# Patient Record
Sex: Female | Born: 1950 | Race: White | Hispanic: No | Marital: Married | Smoking: Former smoker
Health system: Southern US, Community
[De-identification: ages and names within clinical notes are randomized; demographics above are authoritative.]

## PROBLEM LIST (undated history)

## (undated) DIAGNOSIS — J349 Unspecified disorder of nose and nasal sinuses: Secondary | ICD-10-CM

## (undated) DIAGNOSIS — T7840XA Allergy, unspecified, initial encounter: Secondary | ICD-10-CM

## (undated) DIAGNOSIS — E119 Type 2 diabetes mellitus without complications: Secondary | ICD-10-CM

## (undated) DIAGNOSIS — J439 Emphysema, unspecified: Secondary | ICD-10-CM

## (undated) HISTORY — DX: Emphysema, unspecified: J43.9

## (undated) HISTORY — DX: Unspecified disorder of nose and nasal sinuses: J34.9

## (undated) HISTORY — PX: NASAL SINUS SURGERY: SHX719

## (undated) HISTORY — DX: Type 2 diabetes mellitus without complications: E11.9

## (undated) HISTORY — DX: Allergy, unspecified, initial encounter: T78.40XA

## (undated) HISTORY — PX: TUBAL LIGATION: SHX77

---

## 2019-01-26 ENCOUNTER — Other Ambulatory Visit: Payer: Self-pay

## 2019-01-26 ENCOUNTER — Encounter: Payer: Self-pay | Admitting: Internal Medicine

## 2019-01-26 ENCOUNTER — Ambulatory Visit (INDEPENDENT_AMBULATORY_CARE_PROVIDER_SITE_OTHER): Payer: Medicare Other | Admitting: Internal Medicine

## 2019-01-26 VITALS — BP 142/86 | HR 112 | Ht 63.0 in | Wt 132.4 lb

## 2019-01-26 DIAGNOSIS — R918 Other nonspecific abnormal finding of lung field: Secondary | ICD-10-CM

## 2019-01-26 DIAGNOSIS — R05 Cough: Secondary | ICD-10-CM

## 2019-01-26 DIAGNOSIS — R059 Cough, unspecified: Secondary | ICD-10-CM

## 2019-01-26 DIAGNOSIS — J441 Chronic obstructive pulmonary disease with (acute) exacerbation: Secondary | ICD-10-CM

## 2019-01-26 DIAGNOSIS — E1100 Type 2 diabetes mellitus with hyperosmolarity without nonketotic hyperglycemic-hyperosmolar coma (NKHHC): Secondary | ICD-10-CM

## 2019-01-26 DIAGNOSIS — K219 Gastro-esophageal reflux disease without esophagitis: Secondary | ICD-10-CM | POA: Diagnosis not present

## 2019-01-26 DIAGNOSIS — J449 Chronic obstructive pulmonary disease, unspecified: Secondary | ICD-10-CM

## 2019-01-26 LAB — CBC WITH DIFFERENTIAL/PLATELET
Basophils Absolute: 0.1 10*3/uL (ref 0.0–0.1)
Basophils Relative: 0.8 % (ref 0.0–3.0)
Eosinophils Absolute: 0.1 10*3/uL (ref 0.0–0.7)
Eosinophils Relative: 2.2 % (ref 0.0–5.0)
HCT: 39.3 % (ref 36.0–46.0)
HEMOGLOBIN: 13.4 g/dL (ref 12.0–15.0)
Lymphocytes Relative: 22.7 % (ref 12.0–46.0)
Lymphs Abs: 1.5 10*3/uL (ref 0.7–4.0)
MCHC: 34 g/dL (ref 30.0–36.0)
MCV: 82.8 fl (ref 78.0–100.0)
MONO ABS: 0.4 10*3/uL (ref 0.1–1.0)
Monocytes Relative: 6.5 % (ref 3.0–12.0)
Neutro Abs: 4.5 10*3/uL (ref 1.4–7.7)
Neutrophils Relative %: 67.8 % (ref 43.0–77.0)
Platelets: 222 10*3/uL (ref 150.0–400.0)
RBC: 4.75 Mil/uL (ref 3.87–5.11)
RDW: 13.1 % (ref 11.5–15.5)
WBC: 6.6 10*3/uL (ref 4.0–10.5)

## 2019-01-26 MED ORDER — BENZONATATE 200 MG PO CAPS: 200.0000 mg | ORAL_CAPSULE | Freq: Three times a day (TID) | ORAL | 1 refills | Status: DC | PRN

## 2019-01-26 NOTE — Patient Instructions (Addendum)
Order- lab- hypersensitivity pneumonia panel, Allergy profile, CBC w diff,   Dx COPD exacerbation  Permit for record release- all records from Dr Steele Berg at [facility] sent for benzonatate perles for cough

## 2019-01-26 NOTE — Progress Notes (Signed)
01/26/2019- 30 yoF with dx emphysema previously followed by pulmonologist in [place]. She is asking second opinion. I had seen her husband in past for COPD (expired). -----referred by self; Dr. Konrad Dolores [facility] (old pulmonary doctor), been Dx w/ emphysema, has chronic cough  I had cared for husband Konrad Dolores who died around Mar 27, 2010 with emphysema and peripheral arterial disease. I reviewed CT chest image disk from [facility] dated 12/12/2018- noting LUL irregular  density.  She complains of cough x 3 years, worst, and productive of light green phlegm in AM.Sputum Inhalers help.Flovent 220 w/ spacer. Stays hoarse. Rarely uses rescue inhaler. Lives in old family home which has been remediated for leaks. She has done a home self-test for mold, with interpretation pending, but brings a petri dish with what appear to be areas of mold growth. She is worried that her cough is due to mold. An allergy work-up was negative in the past. She understands PFT "mildly abnormal". Denies fever, sweats, weight loss. Denies GERD or heartburn but did have a swallow evaluation.  CT chest ([facility]) 12/12/2018-  Conclusions: There are a few new subcentimeter pulmonary nodules, likely benign, although at imaging follow-up is advised. Consider low-dose chest CT follow-up in 1 year. Small scattered areas of groundglass pulmonary pacification in the right upper lobe suggests acute infectious/inflammatory pulmonary disease. Chronic findings, as reported, including centrilobular and paraseptal emphysema and LAD coronary calcifications.  Prior to Admission medications   Medication Sig Start Date End Date Taking? Authorizing Provider  Albuterol Sulfate 108 (90 Base) MCG/ACT AEPB Inhale into the lungs. Takes 1 puff every 4-6 hours as needed   Yes [provider]  aspirin EC 81 MG tablet Take 81 mg by mouth daily.   Yes [provider]  fluticasone (FLOVENT HFA) 110 MCG/ACT inhaler Inhale 2 puffs into the lungs 2 (two) times  daily.   Yes [provider]  Loratadine 10 MG CAPS Take 10 mg by mouth every morning.   Yes [provider]  metFORMIN (GLUCOPHAGE) 500 MG tablet Take 500 mg by mouth 2 (two) times daily with a meal.   Yes [provider]  sertraline (ZOLOFT) 50 MG tablet Take 50 mg by mouth daily.   Yes [provider]  benzonatate (TESSALON) 200 MG capsule Take 1 capsule (200 mg total) by mouth 3 (three) times daily as needed for cough. 01/26/19   Waymon Budge, MD   Past Medical History:  Diagnosis Date  . Allergy   . Diabetes (HCC)   . Emphysema lung (HCC)   . Sinus trouble    Past Surgical History:  Procedure Laterality Date  . NASAL SINUS SURGERY    . TUBAL LIGATION     Family History  Problem Relation Age of Onset  . Cancer Maternal Grandmother    Social History   Socioeconomic History  . Marital status: Married    Spouse name: Not on file  . Number of children: Not on file  . Years of education: Not on file  . Highest education level: Not on file  Occupational History  . Not on file  Social Needs  . Financial resource strain: Not on file  . Food insecurity:    Worry: Not on file    Inability: Not on file  . Transportation needs:    Medical: Not on file    Non-medical: Not on file  Tobacco Use  . Smoking status: Former Smoker    Packs/day: 1.00    Years: 12.00  Pack years: 12.00    Types: Cigarettes    Last attempt to quit: 01/25/1994    Years since quitting: 25.0  . Smokeless tobacco: Never Used  Substance and Sexual Activity  . Alcohol use: Never    Frequency: Never  . Drug use: Never  . Sexual activity: Not on file  Lifestyle  . Physical activity:    Days per week: Not on file    Minutes per session: Not on file  . Stress: Not on file  Relationships  . Social connections:    Talks on phone: Not on file    Gets together: Not on file    Attends religious service: Not on file    Active member of club or organization: Not on  file    Attends meetings of clubs or organizations: Not on file    Relationship status: Not on file  . Intimate partner violence:    Fear of current or ex partner: Not on file    Emotionally abused: Not on file    Physically abused: Not on file    Forced sexual activity: Not on file  Other Topics Concern  . Not on file  Social History Narrative  . Not on file   ROS-see HPI   + = positive Constitutional:    weight loss, night sweats, fevers, chills, fatigue, lassitude. HEENT:    headaches, +difficulty swallowing, tooth/dental problems, sore throat,       sneezing, +itching, ear ache, nasal congestion, post nasal drip, snoring CV:    chest pain, orthopnea, PND, swelling in lower extremities, anasarca,                                  dizziness, palpitations Resp:   +shortness of breath with exertion or at rest.                +productive cough,   non-productive cough, coughing up of blood.              +change in color of mucus.  wheezing.   Skin:    rash or lesions. GI:  No-   heartburn, indigestion, abdominal pain, nausea, vomiting, diarrhea,                 change in bowel habits, loss of appetite GU: dysuria, change in color of urine, no urgency or frequency.   flank pain. MS:   joint pain, stiffness, decreased range of motion, back pain. Neuro-     nothing unusual Psych:  change in mood or affect.  depression or anxiety.   memory loss.  OBJ- Physical Exam General- Alert, Oriented, Affect-appropriate, Distress- none acute Skin- rash-none, lesions- none, excoriation- none Lymphadenopathy- none Head- atraumatic            Eyes- Gross vision intact, PERRLA, conjunctivae and secretions clear            Ears- Hearing, canals-normal            Nose- Clear, no-Septal dev, mucus, polyps, erosion, perforation             Throat- Mallampati II , mucosa clear , drainage- none, tonsils- atrophic Neck- flexible , trachea midline, no stridor , thyroid nl, carotid no bruit Chest -  symmetrical excursion , unlabored           Heart/CV- RRR , no murmur , no gallop  , no rub, nl s1 s2                           -  JVD- none , edema- none, stasis changes- none, varices- none           Lung- clear to P&A, wheeze- none, cough- none , dullness-none, rub- none           Chest wall-  Abd-  Br/ Gen/ Rectal- Not done, not indicated Extrem- cyanosis- none, clubbing, none, atrophy- none, strength- nl Neuro- grossly intact to observation

## 2019-01-27 LAB — RESPIRATORY ALLERGY PROFILE REGION II ~~LOC~~
Allergen, A. alternata, m6: <0.1 kU/L
Allergen, Cedar tree, t12: <0.1 kU/L
Allergen, Comm Silver Birch, t9: <0.1 kU/L
Allergen, Cottonwood, t14: <0.1 kU/L
Allergen, D pternoyssinus,d7: 0.27 kU/L — ABNORMAL HIGH
Allergen, Mulberry, t76: <0.1 kU/L
Allergen, Oak,t7: <0.1 kU/L
Allergen, P. notatum, m1: <0.1 kU/L
Aspergillus fumigatus, m3: <0.1 kU/L
Bermuda Grass: <0.1 kU/L
Box Elder IgE: <0.1 kU/L
CLADOSPORIUM HERBARUM (M2) IGE: <0.1 kU/L
CLASS: 0
CLASS: 0
CLASS: 0
CLASS: 0
COCKROACH: 3.43 kU/L — AB
COMMON RAGWEED (SHORT) (W1) IGE: <0.1 kU/L
Class: 0
Class: 0
Class: 0
Class: 0
Class: 0
Class: 0
Class: 0
Class: 0
Class: 0
Class: 0
Class: 0
Class: 0
Class: 0
Class: 0
Class: 0
Class: 0
Class: 0
Class: 0
Class: 0
Class: 2
D. farinae: 0.3 kU/L — ABNORMAL HIGH
Dog Dander: <0.1 kU/L
Elm IgE: <0.1 kU/L
IgE (Immunoglobulin E), Serum: 45 kU/L (ref <=114)
Johnson Grass: <0.1 kU/L
Pecan/Hickory Tree IgE: <0.1 kU/L
Sheep Sorrel IgE: <0.1 kU/L

## 2019-01-27 LAB — INTERPRETATION:

## 2019-01-29 DIAGNOSIS — K219 Gastro-esophageal reflux disease without esophagitis: Secondary | ICD-10-CM | POA: Insufficient documentation

## 2019-01-29 DIAGNOSIS — R918 Other nonspecific abnormal finding of lung field: Secondary | ICD-10-CM | POA: Insufficient documentation

## 2019-01-29 DIAGNOSIS — J449 Chronic obstructive pulmonary disease, unspecified: Secondary | ICD-10-CM | POA: Insufficient documentation

## 2019-01-29 DIAGNOSIS — R05 Cough: Secondary | ICD-10-CM | POA: Insufficient documentation

## 2019-01-29 DIAGNOSIS — R059 Cough, unspecified: Secondary | ICD-10-CM | POA: Insufficient documentation

## 2019-01-29 DIAGNOSIS — E119 Type 2 diabetes mellitus without complications: Secondary | ICD-10-CM | POA: Insufficient documentation

## 2019-01-29 NOTE — Assessment & Plan Note (Signed)
Outside records are pending. She has Flovent and rarely uses rescue inhaler.

## 2019-01-29 NOTE — Assessment & Plan Note (Signed)
Potentially related to cough complaint. She describes dysphagia, but not heartburn, and has had swallowing eval. Plan- emphasize reflux precautions

## 2019-01-29 NOTE — Assessment & Plan Note (Signed)
Daily cough x 3 years, worst in the mornings and usually productive of light-green sputum with no blood. We will request records of precious work-up, and await result of her home mold test because this is what most concerns her. Recognize her COPD and GERD may contribute. Plan- hypersensitivity pneumonia and mold antibody assays.

## 2019-01-29 NOTE — Assessment & Plan Note (Signed)
Need to clarify with her how much of her management she wants to continue at [facility]. Radiology report suggests low-dose CT f/u in 1 year (Jan, 2021).

## 2019-01-30 LAB — HYPERSENSITIVITY PNEUMONITIS
A. PULLULANS ABS: NEGATIVE
A.Fumigatus #1 Abs: NEGATIVE
Micropolyspora faeni, IgG: NEGATIVE
Pigeon Serum Abs: NEGATIVE
THERMOACT. SACCHARII: NEGATIVE
Thermoactinomyces vulgaris, IgG: NEGATIVE

## 2019-02-02 ENCOUNTER — Telehealth: Payer: Self-pay | Admitting: Internal Medicine

## 2019-02-02 NOTE — Telephone Encounter (Signed)
Notes recorded by Waymon Budge, MD on 01/30/2019 at 8:08 PM EDT Allergy antibodies are increased for common environmental allergies- house dust mites and cockroach- but not to any of the molds commonly associated with allergic response on our panels. _______________  Alice Patton and spoke with patient she is aware and verbalized understanding. Nothing further needed.

## 2019-03-27 ENCOUNTER — Ambulatory Visit: Payer: Medicare Other | Admitting: Internal Medicine

## 2019-07-07 ENCOUNTER — Ambulatory Visit: Payer: Medicare Other | Admitting: Internal Medicine

## 2019-07-17 ENCOUNTER — Other Ambulatory Visit: Payer: Self-pay

## 2019-07-17 ENCOUNTER — Encounter: Payer: Self-pay | Admitting: Primary Care

## 2019-07-17 ENCOUNTER — Ambulatory Visit (INDEPENDENT_AMBULATORY_CARE_PROVIDER_SITE_OTHER): Payer: Medicare Other | Admitting: Primary Care

## 2019-07-17 DIAGNOSIS — J449 Chronic obstructive pulmonary disease, unspecified: Secondary | ICD-10-CM

## 2019-07-17 DIAGNOSIS — R05 Cough: Secondary | ICD-10-CM

## 2019-07-17 DIAGNOSIS — R918 Other nonspecific abnormal finding of lung field: Secondary | ICD-10-CM

## 2019-07-17 DIAGNOSIS — J441 Chronic obstructive pulmonary disease with (acute) exacerbation: Secondary | ICD-10-CM

## 2019-07-17 DIAGNOSIS — R059 Cough, unspecified: Secondary | ICD-10-CM

## 2019-07-17 MED ORDER — FLUTICASONE PROPIONATE HFA 110 MCG/ACT IN AERO: 2.0000 | INHALATION_SPRAY | Freq: Two times a day (BID) | RESPIRATORY_TRACT | 1 refills | Status: DC

## 2019-07-17 MED ORDER — BENZONATATE 200 MG PO CAPS: 200.0000 mg | ORAL_CAPSULE | Freq: Three times a day (TID) | ORAL | 1 refills | Status: DC | PRN

## 2019-07-17 MED ORDER — ALBUTEROL SULFATE 108 (90 BASE) MCG/ACT IN AEPB: 2.0000 | INHALATION_SPRAY | Freq: Two times a day (BID) | RESPIRATORY_TRACT | 0 refills | Status: DC

## 2019-07-17 NOTE — Assessment & Plan Note (Addendum)
-   No shortness of breath or wheezing  - Using flovent/albuterol hfa as needed  - Offered influenza vaccine but patient declined- will follow up with PCP

## 2019-07-17 NOTE — Assessment & Plan Note (Signed)
-   Needs follow-up CT chest in Jan 2021  - CT chest 12/12/18 with [facility] subcentimeter

## 2019-07-17 NOTE — Progress Notes (Signed)
 @Patient  ID: Alice Patton, female    DOB: 1951-06-04, 68 y.o.   MRN: [id number]  Chief Complaint  Patient presents with   Follow-up    follow-up COPD.  pt states she is doing well, states that her sob and cough have improved greatly.  has not used albuterol or flovent in 1 month.     Referring provider: Carolan ClinesSnider, Gina M, PA  HPI: 68 year old female, former smoker quit in 1995. PMH significant for COPD mixed, emphysema, lung nodules, GERD. Patient of Dr. Maple HudsonYoung, seen for second opinion/initial consult on 01/26/19 for cough x 3 years. [place]. Allergy work up was negative in the past. PFTs mildly abnormal. Maintained on Flovent 110 twice daily. Rare SABA. Due for repeat CT chest in Jan 2021. Ordered for hypersensitivity pneumonia panel, allergy profile. Given Rx for tessalon perles.   07/17/2019 Patient presents today for follow-up visit from March, rescheduled from May d/t covid precautions. Labs showed allergy antibodies are increased for common environmental allergies such as dust mites and cockroach but not any molds. Hypersensitivity pneumonia panel negative. IgE 45, eosinophils normal.   She is doing really well. In the last month she hasn't needed to use her Flovent or Albuterol rescue inhaler. Her cough has improved greatly, tessalon Perles have helped. She has morning cough only. Rest of the day she is good. She continues Claritin daily. She does not report any significant shortness of breath. Mild dyspnea when chasing the dog but it's never really bad.    Significant testing: CT chest ([facility]) 12/12/2018-  Conclusions: There are a few new subcentimeter pulmonary nodules, likely benign, although at imaging follow-up is advised. Consider low-dose chest CT follow-up in 1 year. Small scattered areas of groundglass pulmonary pacification in the right upper lobe suggests acute infectious/inflammatory pulmonary disease. Chronic findings, as reported, including  centrilobular and paraseptal emphysema and LAD coronary calcifications.  No Known Allergies  Immunization History  Administered Date(s) Administered   Influenza-Unspecified 08/27/2018   Pneumococcal-Unspecified 01/26/2016    Past Medical History:  Diagnosis Date   Allergy    Diabetes (HCC)    Emphysema lung (HCC)    Sinus trouble     Tobacco History: Social History   Tobacco Use  Smoking Status Former Smoker   Packs/day: 1.00   Years: 12.00   Pack years: 12.00   Types: Cigarettes   Quit date: 01/25/1994   Years since quitting: 25.4  Smokeless Tobacco Never Used   Counseling given: Not Answered   Outpatient Medications Prior to Visit  Medication Sig Dispense Refill   aspirin EC 81 MG tablet Take 81 mg by mouth daily.     Loratadine 10 MG CAPS Take 10 mg by mouth every morning.     metFORMIN (GLUCOPHAGE) 500 MG tablet Take 500 mg by mouth 2 (two) times daily with a meal.     sertraline (ZOLOFT) 50 MG tablet Take 50 mg by mouth daily.     benzonatate (TESSALON) 200 MG capsule Take 1 capsule (200 mg total) by mouth 3 (three) times daily as needed for cough. 30 capsule 1   Albuterol Sulfate 108 (90 Base) MCG/ACT AEPB Inhale into the lungs. Takes 1 puff every 4-6 hours as needed     fluticasone (FLOVENT HFA) 110 MCG/ACT inhaler Inhale 2 puffs into the lungs 2 (two) times daily.     No facility-administered medications prior to visit.     Review of Systems  Review of Systems  Constitutional: Negative.  Respiratory: Positive for cough. Negative for shortness of breath and wheezing.     Physical Exam  BP (!) 122/58 (Cuff Size: Normal)    Pulse 82    Temp 97.6 F (36.4 C) (Oral)    Ht 5\' 3"  (1.6 m)    Wt 134 lb 12.8 oz (61.1 kg)    SpO2 98%    BMI 23.88 kg/m  Physical Exam Constitutional:      Appearance: Normal appearance.  HENT:     Head: Normocephalic and atraumatic.     Right Ear: Tympanic membrane normal.     Left Ear: Tympanic membrane  normal.     Nose: Nose normal.     Mouth/Throat:     Mouth: Mucous membranes are moist.     Pharynx: Oropharynx is clear.  Cardiovascular:     Rate and Rhythm: Normal rate and regular rhythm.  Pulmonary:     Effort: Pulmonary effort is normal.     Breath sounds: Normal breath sounds.     Comments: CTA Neurological:     General: No focal deficit present.     Mental Status: She is alert and oriented to person, place, and time. Mental status is at baseline.  Psychiatric:        Mood and Affect: Mood normal.        Behavior: Behavior normal.        Thought Content: Thought content normal.        Judgment: Judgment normal.      Lab Results:  CBC    Component Value Date/Time   WBC 6.6 01/26/2019 1531   RBC 4.75 01/26/2019 1531   HGB 13.4 01/26/2019 1531   HCT 39.3 01/26/2019 1531   PLT 222.0 01/26/2019 1531   MCV 82.8 01/26/2019 1531   MCHC 34.0 01/26/2019 1531   RDW 13.1 01/26/2019 1531   LYMPHSABS 1.5 01/26/2019 1531   MONOABS 0.4 01/26/2019 1531   EOSABS 0.1 01/26/2019 1531   BASOSABS 0.1 01/26/2019 1531    BMET No results found for: NA, K, CL, CO2, GLUCOSE, BUN, CREATININE, CALCIUM, GFRNONAA, GFRAA  BNP No results found for: BNP  ProBNP No results found for: PROBNP  Imaging: No results found.   Assessment & Plan:   Cough - Improved - Hypersensitivity pneumonia panel negative. IgE 45, eosinophils normal - Continue claritin daily and prn tessalon perles for cough   Multiple lung nodules on CT - Needs follow-up CT chest in Jan 2021  - CT chest 12/12/18 with [facility] sub[facility]imeter  COPD mixed type ([place]) - No shortness of breath or wheezing  - Using flovent/albuterol hfa as needed  - Offered influenza vaccine but patient declined- will follow up with PCP     Martyn Ehrich, NP 07/17/2019

## 2019-07-17 NOTE — Patient Instructions (Addendum)
Testing results: - Labs showed allergy antibodies are increased for common environmental allergies such as dust mites and cockroach but not any molds - Hypersensitivity pneumonia panel negative - IgE 45, eosinophils normal  Recommendations: - Continue Claritin daily - Use Flovent 1-2 puffs twice daily as needed if cough symptoms return (refill sent) - Albuterol rescue inhaler every 6 hours for shortness of breath/wheezing (refill sent) - Tessalon perles three times a day for cough as needed (refill sent)  Orders: - Repeat CT chest wo contast in Jan 2021 re: pulmonary nodules  *Patient requesting [place] (fax to [place])  Follow up: - 6 months with Dr. Annamaria Boots or if symptoms worsen

## 2019-07-17 NOTE — Assessment & Plan Note (Addendum)
-   Improved - Hypersensitivity pneumonia panel negative. IgE 45, eosinophils normal - Continue claritin daily and prn tessalon perles for cough

## 2019-12-18 ENCOUNTER — Telehealth: Payer: Self-pay | Admitting: Internal Medicine

## 2019-12-18 DIAGNOSIS — R918 Other nonspecific abnormal finding of lung field: Secondary | ICD-10-CM

## 2019-12-18 NOTE — Telephone Encounter (Signed)
I do not see where a CT has been ordered by Dr. Maple Hudson.

## 2019-12-18 NOTE — Telephone Encounter (Signed)
Dr. Maple Hudson I called the patient and she stated she does have the imaging on CD. She already has an appointment to see you on 01/15/20.  Advised patient the PCC's will contact her about scheduling the Chest CT (w/o contrast) to be done prior to her visit with Dr. Maple Hudson so she can discuss the results of that CT and the comparison with the 2020 CT during the visit. Order placed. Nothing further needed at this time.

## 2019-12-18 NOTE — Telephone Encounter (Signed)
Dr. Maple Hudson, Please advise if this patient is to have a CT scan prior to seeing you in March. Looking through the chart I do not see where one has been ordered or mentioned.

## 2019-12-18 NOTE — Telephone Encounter (Signed)
She had a CT chest at a [facility] facility [facility]?)  12/12/2018 and was suggested to have repeat in 1 year.  Suggest we send order to [facility] for continuity and comparison with previous study ( please try to confirm that's where she had the last one).         CT chest, no contrast     dx lung nodules Ask her to bring [place] disc of images and report.

## 2020-01-15 ENCOUNTER — Other Ambulatory Visit: Payer: Self-pay

## 2020-01-15 ENCOUNTER — Encounter: Payer: Self-pay | Admitting: Internal Medicine

## 2020-01-15 ENCOUNTER — Ambulatory Visit (INDEPENDENT_AMBULATORY_CARE_PROVIDER_SITE_OTHER): Payer: Medicare Other | Admitting: Internal Medicine

## 2020-01-15 DIAGNOSIS — J449 Chronic obstructive pulmonary disease, unspecified: Secondary | ICD-10-CM | POA: Diagnosis not present

## 2020-01-15 DIAGNOSIS — R918 Other nonspecific abnormal finding of lung field: Secondary | ICD-10-CM

## 2020-01-15 NOTE — Progress Notes (Signed)
HPI F former smoker followed for COPD.chronic Cough, Lung Nodules, CAD, GERD, DM2,  PFT 12/13/2018- [facility]- Mild obstruction, Diffusion mildly reduced.FVC 2.83/ 103%, FEV1 1.98/ 101%, R 0.70, FEF25-75 1.26/ 56%, TLC 92%, DLCO 63% Lab- 01/16/19- Hypersens pneumonia Neg, Respiratory allergy Profile- elevated IgE for dust mite, cockroach, not for molds ------------------------------------------------------------  3/12/2020May 06, [age] yoF with dx emphysema previously followed by pulmonologist in [place]. She is asking second opinion. I had seen her husband in past for COPD (expired). -----referred by self; Dr. Evonnie Dawes Chest (old pulmonary doctor), been Dx w/ emphysema, has chronic cough  I had cared for husband Wynetta Emery who died around 2010/03/21 with emphysema and peripheral arterial disease. I reviewed CT chest image disk from [facility] dated 12/12/2018- noting LUL irregular  density.  She complains of cough x 3 years, worst, and productive of light green phlegm in AM.Sputum Inhalers help.Flovent 220 w/ spacer. Stays hoarse. Rarely uses rescue inhaler. Lives in old family home which has been remediated for leaks. She has done a home self-test for mold, with interpretation pending, but brings a petri dish with what appear to be areas of mold growth. She is worried that her cough is due to mold. An allergy work-up was negative in the past. She understands PFT "mildly abnormal". Denies fever, sweats, weight loss. Denies GERD or heartburn but did have a swallow evaluation.  CT chest ([facility]) 12/12/2018-  Conclusions: There are a few new subcentimeter pulmonary nodules, likely benign, although at imaging follow-up is advised. Consider low-dose chest CT follow-up in 1 year. Small scattered areas of groundglass pulmonary opacification in the right upper lobe suggests acute infectious/inflammatory pulmonary disease. Chronic findings, as reported, including centrilobular and paraseptal emphysema and LAD coronary  calcifications.  01/15/20- 38 yoF former smoker followed for COPD.chronic Cough, Lung Nodules, CAD, GERD, DM2,  Albuterol hfa, Flovent 110,   -----f/u COPD. Breathing is at patient's baseline.  Had flu vax. Not covid and expresses anxiety- discussed. No recent rescue inhaler. Little morning cough, scant clear. Cough is much better than last year.   PFT 12/13/2018- [facility]- Mild obstruction, Diffusion mildly reduced.FVC 2.83/ 103%, FEV1 1.98/ 101%, R 0.70, FEF25-75 1.26/ 56%, TLC 92%, DLCO 63% CT chest 12/28/19-  Lab- 01/16/19- Hypersens pneumonia Neg, Respiratory allergy Profile- elevated IgE for dust mite, cockroach, not for molds CT chest 12/28/19- [facility]- Subtle scattered small pulmonary nodules, largest in lingula measuring 45mm, stable to less conspicuous than on prior CTs. Areas of groundglass have resolved. Stable scaring. Rec continued surveillance  ROS-see HPI   + = positive Constitutional:    weight loss, night sweats, fevers, chills, fatigue, lassitude. HEENT:    headaches, +difficulty swallowing, tooth/dental problems, sore throat,       sneezing, itching, ear ache, nasal congestion, post nasal drip, snoring CV:    chest pain, orthopnea, PND, swelling in lower extremities, anasarca,                                  dizziness, palpitations Resp:   +shortness of breath with exertion or at rest.                +productive cough,   non-productive cough, coughing up of blood.              change in color of mucus.  wheezing.   Skin:    rash or lesions. GI:  No-   heartburn, indigestion, abdominal pain, nausea, vomiting,  diarrhea,                 change in bowel habits, loss of appetite GU: dysuria, change in color of urine, no urgency or frequency.   flank pain. MS:   joint pain, stiffness, decreased range of motion, back pain. Neuro-     nothing unusual Psych:  change in mood or affect.  depression or anxiety.   memory loss.  OBJ- Physical Exam General- Alert, Oriented,  Affect-appropriate, Distress- none acute Skin- rash-none, lesions- none, excoriation- none Lymphadenopathy- none Head- atraumatic            Eyes- Gross vision intact, PERRLA, conjunctivae and secretions clear            Ears- Hearing, canals-normal            Nose- Clear, no-Septal dev, mucus, polyps, erosion, perforation             Throat- Mallampati II , mucosa clear , drainage- none, tonsils- atrophic Neck- flexible , trachea midline, no stridor , thyroid nl, carotid no bruit Chest - symmetrical excursion , unlabored           Heart/CV- RRR , no murmur , no gallop  , no rub, nl s1 s2                           - JVD- none , edema- none, stasis changes- none, varices- none           Lung- clear to P&A, wheeze- none, cough- none , dullness-none, rub- none           Chest wall-  Abd-  Br/ Gen/ Rectal- Not done, not indicated Extrem- cyanosis- none, clubbing, none, atrophy- none, strength- nl Neuro- grossly intact to observation

## 2020-01-15 NOTE — Patient Instructions (Signed)
I am really glad you feel well.  Please call if we can help.  We will be in touch after I review the disc from radiology.

## 2020-01-20 NOTE — Assessment & Plan Note (Signed)
Very mild emphysema pattern with little cough or use of bronchodilators. Plan - keep albuterol rescue available

## 2020-01-20 NOTE — Assessment & Plan Note (Addendum)
Probably inflammatory. Most recent CT stable or improved. Former smoker so we will continue to watch.

## 2020-01-22 ENCOUNTER — Telehealth: Payer: Self-pay

## 2020-01-22 NOTE — Telephone Encounter (Signed)
Spoke with patient regarding CT chest with out contrast. CT show's improvement with no new module's probably scarring. Advised  Patient we will continue to follow. Patient voice was understanding. Nothing else further needed.

## 2020-05-08 ENCOUNTER — Other Ambulatory Visit: Payer: Self-pay | Admitting: Internal Medicine

## 2020-05-08 DIAGNOSIS — J441 Chronic obstructive pulmonary disease with (acute) exacerbation: Secondary | ICD-10-CM

## 2021-01-15 NOTE — Progress Notes (Signed)
HPI F former smoker followed for COPD.chronic Cough, Lung Nodules, CAD/ Afib, GERD, DM2,  PFT 12/13/2018- [facility]- Mild obstruction, Diffusion mildly reduced.FVC 2.83/ 103%, FEV1 1.98/ 101%, R 0.70, FEF25-75 1.26/ 56%, TLC 92%, DLCO 63% Lab- 01/16/19- Hypersens pneumonia Neg, Respiratory allergy Profile- elevated IgE for dust mite, cockroach, not for molds ------------------------------------------------------------.  01/15/20- 68 yoF former smoker followed for COPD.chronic Cough, Lung Nodules, CAD, AFib, GERD, DM2,  Albuterol hfa, Flovent 110,   -----f/u COPD. Breathing is at patient's baseline.  Had flu vax. Not covid and expresses anxiety- discussed. No recent rescue inhaler. Little morning cough, scant clear. Cough is much better than last year.  Doing well since cardioversion. PFT 12/13/2018- [facility]- Mild obstruction, Diffusion mildly reduced.FVC 2.83/ 103%, FEV1 1.98/ 101%, R 0.70, FEF25-75 1.26/ 56%, TLC 92%, DLCO 63% CT chest 12/28/19-  Lab- 01/16/19- Hypersens pneumonia Neg, Respiratory allergy Profile- elevated IgE for dust mite, cockroach, not for molds CT chest 12/28/19- [facility]- Subtle scattered small pulmonary nodules, largest in lingula measuring 50mm, stable to less conspicuous than on prior CTs. Areas of groundglass have resolved. Stable scaring. Rec continued surveillance  01/16/21- 69 yoF former smoker followed for COPD.chronic Cough, Lung Nodules, CAD, AFib cardioverted, GERD, DM2,  -Albuterol hfa, Flovent 110,   Still followed at [facility]?? Says not. Covid vax-none Flu vax-none Slight morning cough, scant phlegm. Otherwise doing well since cardioversion. Discussed updating CT for lung nodule status in this former smoker.  ROS-see HPI   + = positive Constitutional:    weight loss, night sweats, fevers, chills, fatigue, lassitude. HEENT:    headaches, +difficulty swallowing, tooth/dental problems, sore throat,       sneezing, itching, ear ache, nasal congestion, post  nasal drip, snoring CV:    chest pain, orthopnea, PND, swelling in lower extremities, anasarca,                                   dizziness, palpitations Resp:   +shortness of breath with exertion or at rest.                +productive cough,   non-productive cough, coughing up of blood.              change in color of mucus.  wheezing.   Skin:    rash or lesions. GI:  No-   heartburn, indigestion, abdominal pain, nausea, vomiting, diarrhea,                 change in bowel habits, loss of appetite GU: dysuria, change in color of urine, no urgency or frequency.   flank pain. MS:   joint pain, stiffness, decreased range of motion, back pain. Neuro-     nothing unusual Psych:  change in mood or affect.  depression or anxiety.   memory loss.  OBJ- Physical Exam General- Alert, Oriented, Affect-appropriate, Distress- none acute Skin- rash-none, lesions- none, excoriation- none Lymphadenopathy- none Head- atraumatic            Eyes- Gross vision intact, PERRLA, conjunctivae and secretions clear            Ears- Hearing, canals-normal            Nose- Clear, no-Septal dev, mucus, polyps, erosion, perforation             Throat- Mallampati II , mucosa clear , drainage- none, tonsils- atrophic Neck- flexible , trachea midline, no stridor ,  thyroid nl, carotid no bruit Chest - symmetrical excursion , unlabored           Heart/CV- RRR , no murmur , no gallop  , no rub, nl s1 s2                           - JVD- none , edema- none, stasis changes- none, varices- none           Lung- clear to P&A, wheeze- none, cough- none , dullness-none, rub- none           Chest wall-  Abd-  Br/ Gen/ Rectal- Not done, not indicated Extrem- cyanosis- none, clubbing, none, atrophy- none, strength- nl Neuro- grossly intact to observation

## 2021-01-16 ENCOUNTER — Ambulatory Visit (INDEPENDENT_AMBULATORY_CARE_PROVIDER_SITE_OTHER): Payer: Medicare Other | Admitting: Internal Medicine

## 2021-01-16 ENCOUNTER — Encounter: Payer: Self-pay | Admitting: Internal Medicine

## 2021-01-16 ENCOUNTER — Other Ambulatory Visit: Payer: Self-pay

## 2021-01-16 VITALS — BP 118/60 | HR 88 | Temp 97.6°F | Ht 63.0 in | Wt 133.6 lb

## 2021-01-16 DIAGNOSIS — R918 Other nonspecific abnormal finding of lung field: Secondary | ICD-10-CM

## 2021-01-16 DIAGNOSIS — J449 Chronic obstructive pulmonary disease, unspecified: Secondary | ICD-10-CM

## 2021-01-16 DIAGNOSIS — J441 Chronic obstructive pulmonary disease with (acute) exacerbation: Secondary | ICD-10-CM | POA: Diagnosis not present

## 2021-01-16 MED ORDER — FLOVENT HFA 110 MCG/ACT IN AERO: INHALATION_SPRAY | RESPIRATORY_TRACT | 4 refills | Status: DC

## 2021-01-16 MED ORDER — ALBUTEROL SULFATE 108 (90 BASE) MCG/ACT IN AEPB: INHALATION_SPRAY | RESPIRATORY_TRACT | 4 refills | Status: DC

## 2021-01-16 NOTE — Patient Instructions (Signed)
Order- schedule HRCT multiple lung nodules compare with [facility] previous  Refills sent for Flovent 110 and albuterol hfa  Please call if we can help

## 2021-04-27 NOTE — Assessment & Plan Note (Signed)
Likely benign. Plan- update HRCT

## 2021-04-27 NOTE — Assessment & Plan Note (Signed)
Mild persistent uncomplicated. No exacerbation Plan- continue current meds

## 2021-12-18 ENCOUNTER — Other Ambulatory Visit: Payer: Self-pay

## 2021-12-18 ENCOUNTER — Encounter: Payer: Self-pay | Admitting: Adult Health

## 2021-12-18 ENCOUNTER — Ambulatory Visit (INDEPENDENT_AMBULATORY_CARE_PROVIDER_SITE_OTHER): Payer: Medicare Other

## 2021-12-18 ENCOUNTER — Ambulatory Visit (INDEPENDENT_AMBULATORY_CARE_PROVIDER_SITE_OTHER): Payer: Medicare Other | Admitting: Adult Health

## 2021-12-18 VITALS — BP 120/60 | HR 76 | Temp 98.8°F | Ht 63.0 in | Wt 129.8 lb

## 2021-12-18 DIAGNOSIS — J9691 Respiratory failure, unspecified with hypoxia: Secondary | ICD-10-CM

## 2021-12-18 DIAGNOSIS — J449 Chronic obstructive pulmonary disease, unspecified: Secondary | ICD-10-CM

## 2021-12-18 DIAGNOSIS — J9690 Respiratory failure, unspecified, unspecified whether with hypoxia or hypercapnia: Secondary | ICD-10-CM | POA: Insufficient documentation

## 2021-12-18 DIAGNOSIS — J441 Chronic obstructive pulmonary disease with (acute) exacerbation: Secondary | ICD-10-CM

## 2021-12-18 DIAGNOSIS — J9611 Chronic respiratory failure with hypoxia: Secondary | ICD-10-CM | POA: Insufficient documentation

## 2021-12-18 MED ORDER — PREDNISONE 10 MG PO TABS: ORAL_TABLET | ORAL | 0 refills | Status: DC

## 2021-12-18 MED ORDER — BREZTRI AEROSPHERE 160-9-4.8 MCG/ACT IN AERO: 2.0000 | INHALATION_SPRAY | Freq: Two times a day (BID) | RESPIRATORY_TRACT | 0 refills | Status: DC

## 2021-12-18 MED ORDER — BREZTRI AEROSPHERE 160-9-4.8 MCG/ACT IN AERO: 2.0000 | INHALATION_SPRAY | Freq: Two times a day (BID) | RESPIRATORY_TRACT | 6 refills | Status: DC

## 2021-12-18 NOTE — Assessment & Plan Note (Signed)
Now with exertional hypoxemia - requires 3 l/m of O2 with activity to keep sats >88-90.   Plan  Patient Instructions  Prednisone 20mg  daily for 5 days then 20mg  daily for 5 days and stop.  Mucinex DM Twice daily  As needed  cough/congestion  Stop Flovent .  Begin BREZTRI 2 puffs Twice daily  rinse after use.  Albuterol inhaler As needed   Begin Oxygen 3l/m.  Follow up in 1 week with Dr.  or Dorinda Stehr NP and As needed   Please contact office for sooner follow up if symptoms do not improve or worsen or seek emergency care

## 2021-12-18 NOTE — Patient Instructions (Addendum)
Prednisone 20mg  daily for 5 days then 20mg  daily for 5 days and stop.  Mucinex DM Twice daily  As needed  cough/congestion  Stop Flovent .  Begin BREZTRI 2 puffs Twice daily  rinse after use.  Albuterol inhaler As needed   Begin Oxygen 3l/m.  Follow up in 1 week with Dr. Annamaria Boots  or Jailee Jaquez NP and As needed   Please contact office for sooner follow up if symptoms do not improve or worsen or seek emergency care

## 2021-12-18 NOTE — Assessment & Plan Note (Signed)
Slow to resolve COPD flare - will hold on abx at this time  CXR today with NAD, chronic emphysema and fibrosis .  Slow steroid taper  Increase maintenance regimen   Plan  Patient Instructions  Prednisone 20mg  daily for 5 days then 20mg  daily for 5 days and stop.  Mucinex DM Twice daily  As needed  cough/congestion  Stop Flovent .  Begin BREZTRI 2 puffs Twice daily  rinse after use.  Albuterol inhaler As needed   Begin Oxygen 2l/m.  Follow up in 1 week with Dr.  or Kirbie Stodghill NP and As needed   Please contact office for sooner follow up if symptoms do not improve or worsen or seek emergency care

## 2021-12-18 NOTE — Addendum Note (Signed)
Addended by: Delrae Rend on: 12/18/2021 05:27 PM   Modules accepted: Orders

## 2021-12-18 NOTE — Progress Notes (Signed)
@Patient  ID: Alice Patton, female    DOB: December 18, 1950, 71 y.o.   MRN: [id number]  Chief Complaint  Patient presents with   Acute Visit    Referring provider: Luanne Bras, PA  HPI: 71 year old female former smoker followed for Mild COPD, chronic cough and lung nodules Medical history significant for coronary artery disease status post A-fib, diabetes  TEST/EVENTS :  PFT 12/13/2018- [facility]- Mild obstruction, Diffusion mildly reduced.FVC 2.83/ 103%, FEV1 1.98/ 101%, R 0.70, FEF25-75 1.26/ 56%, TLC 92%, DLCO 63% Lab- 01/16/19- Hypersens pneumonia Neg, Respiratory allergy Profile- elevated IgE for dust mite, cockroach, not for molds  CT chest 12/28/19- [facility]- Subtle scattered small pulmonary nodules, largest in lingula measuring 19mm, stable to less conspiculous  High-resolution CT chest January 24, 2021 showed mild diffuse subpleural reticular opacities and septal thickening suggestive of mild pulmonary fibrosis or changes of NSIP.  No honeycombing or architectural distortion.  Few scattered subpleural nodules that are unchanged compared to previous exams of favored benign etiology.  COPD changes  12/18/2021 Acute OV : COPD  Patient presents for an acute office visit.  She complains of 4 weeks of cough, congestion with green mucus , shortness of breath, nasal drainage , low oxygen level. Felt she was getting a cold than has progressively been getting worse. Seen by PCP . Flu and covid were negative. Treated with Doxycycline and Prednisone taper. Felt some better but still short of breath . O2 sats at home have been low with activity 84-92%. Since being sick she started back on Flovent and albuterol .  Today on arrival O2 sats 84% on room air .  Chest xray today with Emphysema , no acute process. Bilateral lower lobe fibrotic changes.  Says she has been doing very well. Was not using Flovent , felt so good.  Appetite is good w/ no n/v/d.  Patient remains on Flovent twice daily.  Lives alone,  drives, does cleaning, cooking and shopping  Plays with grandkids.     No Known Allergies  Immunization History  Administered Date(s) Administered   Fluad Quad(high Dose 65+) 07/18/2019   Influenza Split 09/14/2016, 09/09/2017, 10/10/2018   Influenza, High Dose Seasonal PF 09/14/2016, 09/09/2017, 10/10/2018, 07/18/2019, 09/23/2019   Influenza-Unspecified 08/27/2018   Pneumococcal Conjugate-13 01/26/2016   Pneumococcal-Unspecified 01/26/2016    Past Medical History:  Diagnosis Date   Allergy    Diabetes ([place])    Emphysema lung ([place])    Sinus trouble     Tobacco History: Social History   Tobacco Use  Smoking Status Former   Packs/day: 1.00   Years: 12.00   Pack years: 12.00   Types: Cigarettes   Quit date: 01/25/1994   Years since quitting: 27.9  Smokeless Tobacco Never   Counseling given: Not Answered   Outpatient Medications Prior to Visit  Medication Sig Dispense Refill   Albuterol Sulfate (PROAIR RESPICLICK) 123XX123 (90 Base) MCG/ACT AEPB Takes 1 puff every 4-6 hours as needed 3 each 4   aspirin EC 81 MG tablet Take 81 mg by mouth daily.     atorvastatin (LIPITOR) 40 MG tablet Take by mouth.     diltiazem (CARDIZEM CD) 240 MG 24 hr capsule Take 240 mg by mouth daily.     flecainide (TAMBOCOR) 50 MG tablet Take 50 mg by mouth 2 (two) times daily.     fluticasone (FLOVENT HFA) 110 MCG/ACT inhaler Inhale 2 puffs then rinse mouth, twice daily 36 g 4   Loratadine 10 MG CAPS Take 10 mg by  mouth every morning.     metFORMIN (GLUCOPHAGE) 500 MG tablet Take 500 mg by mouth 2 (two) times daily with a meal.     sertraline (ZOLOFT) 50 MG tablet Take 50 mg by mouth daily.     benzonatate (TESSALON) 200 MG capsule Take 1 capsule (200 mg total) by mouth 3 (three) times daily as needed for cough. (Patient not taking: Reported on 12/18/2021) 30 capsule 1   No facility-administered medications prior to visit.     Review of Systems:   Constitutional:   No  weight loss, night  sweats,  Fevers, chills,  +fatigue, or  lassitude.  HEENT:   No headaches,  Difficulty swallowing,  Tooth/dental problems, or  Sore throat,                No sneezing, itching, ear ache,  +nasal congestion, post nasal drip,   CV:  No chest pain,  Orthopnea, PND, swelling in lower extremities, anasarca, dizziness, palpitations, syncope.   GI  No heartburn, indigestion, abdominal pain, nausea, vomiting, diarrhea, change in bowel habits, loss of appetite, bloody stools.   Resp: g.  No chest wall deformity  Skin: no rash or lesions.  GU: no dysuria, change in color of urine, no urgency or frequency.  No flank pain, no hematuria   MS:  No joint pain or swelling.  No decreased range of motion.  No back pain.    Physical Exam  BP 120/60 (BP Location: Right Arm, Patient Position: Sitting, Cuff Size: Normal)    Pulse 76    Temp 98.8 F (37.1 C) (Oral)    Ht 5\' 3"  (1.6 m)    Wt 129 lb 12.8 oz (58.9 kg)    SpO2 98% Comment: 83% on RA with exertion, placed on 2L, up yo 98%   BMI 22.99 kg/m   GEN: A/Ox3; pleasant , NAD, well nourished    HEENT:  [place]/AT,   NOSE-clear, THROAT-clear, no lesions, no postnasal drip or exudate noted.   NECK:  Supple w/ fair ROM; no JVD; normal carotid impulses w/o bruits; no thyromegaly or nodules palpated; no lymphadenopathy.    RESP  Clear  P & A; w/o, wheezes/ rales/ or rhonchi. no accessory muscle use, no dullness to percussion  CARD:  RRR, no m/r/g, no peripheral edema, pulses intact, no cyanosis or clubbing. Neg calf pain, neg homans sign   GI:   Soft & nt; nml bowel sounds; no organomegaly or masses detected.   Musco: Warm bil, no deformities or joint swelling noted.   Neuro: alert, no focal deficits noted.    Skin: Warm, no lesions or rashes    Lab Results:  CBC   BMET No results found for: NA, K, CL, CO2, GLUCOSE, BUN, CREATININE, CALCIUM, GFRNONAA, GFRAA  BNP No results found for: BNP  ProBNP No results found for:  PROBNP  Imaging: DG Chest 2 View  Result Date: 12/18/2021 CLINICAL DATA:  COPD exacerbation EXAM: CHEST - 2 VIEW COMPARISON:  CT chest dated December 12, 2018 FINDINGS: The heart size and mediastinal contours are within normal limits. Advanced emphysematous changes prominent in the upper lobes. Bilateral lower lobe fibrotic changes. No focal consolidation or pleural effusion. No appreciable pneumothorax. The visualized skeletal structures are unremarkable. IMPRESSION: COPD without evidence of acute cardiopulmonary process. Electronically Signed   By: Keane Police D.O.   On: 12/18/2021 15:03      No flowsheet data found.  No results found for: NITRICOXIDE      Assessment &  Plan:   COPD mixed type ([place]) Slow to resolve COPD flare - will hold on abx at this time  CXR today with NAD, chronic emphysema and fibrosis .  Slow steroid taper  Increase maintenance regimen   Plan  Patient Instructions  Prednisone 20mg  daily for 5 days then 20mg  daily for 5 days and stop.  Mucinex DM Twice daily  As needed  cough/congestion  Stop Flovent .  Begin BREZTRI 2 puffs Twice daily  rinse after use.  Albuterol inhaler As needed   Begin Oxygen 2l/m.  Follow up in 1 week with Dr. Annamaria Boots  or Rayvion Stumph NP and As needed   Please contact office for sooner follow up if symptoms do not improve or worsen or seek emergency care        Respiratory failure [facility]) Now with exertional hypoxemia - requires 3 l/m of O2 with activity to keep sats >88-90.   Plan  Patient Instructions  Prednisone 20mg  daily for 5 days then 20mg  daily for 5 days and stop.  Mucinex DM Twice daily  As needed  cough/congestion  Stop Flovent .  Begin BREZTRI 2 puffs Twice daily  rinse after use.  Albuterol inhaler As needed   Begin Oxygen 3l/m.  Follow up in 1 week with Dr. Annamaria Boots  or Kanin Lia NP and As needed   Please contact office for sooner follow up if symptoms do not improve or worsen or seek emergency care       I spent    41 minutes dedicated to the care of this patient on the date of this encounter to include pre-visit review of records, face-to-face time with the patient discussing conditions above, post visit ordering of testing, clinical documentation with the electronic health record, making appropriate referrals as documented, and communicating necessary findings to members of the patients care team.     Rexene Edison, NP 12/18/2021

## 2021-12-18 NOTE — Addendum Note (Signed)
Addended by: Vanessa Barbara on: 12/18/2021 05:32 PM   Modules accepted: Orders

## 2021-12-19 ENCOUNTER — Telehealth: Payer: Self-pay | Admitting: Adult Health

## 2021-12-19 MED ORDER — BREZTRI AEROSPHERE 160-9-4.8 MCG/ACT IN AERO: 2.0000 | INHALATION_SPRAY | Freq: Two times a day (BID) | RESPIRATORY_TRACT | 5 refills | Status: DC

## 2021-12-19 NOTE — Telephone Encounter (Signed)
Spoke to patient, who is requesting sample of Breztri. She is aware that we do not currently have samples of Breztri.  Rx sent to [facility] per patient request.  She stated that [facility] received prednisone and it is ready for pickup. She plans to pick it up today.  Nothing further needed at this time.

## 2021-12-25 ENCOUNTER — Ambulatory Visit (INDEPENDENT_AMBULATORY_CARE_PROVIDER_SITE_OTHER): Payer: Medicare Other | Admitting: Adult Health

## 2021-12-25 ENCOUNTER — Other Ambulatory Visit: Payer: Self-pay

## 2021-12-25 ENCOUNTER — Encounter: Payer: Self-pay | Admitting: Adult Health

## 2021-12-25 VITALS — BP 110/60 | HR 90 | Temp 98.1°F | Ht 62.0 in | Wt 130.2 lb

## 2021-12-25 DIAGNOSIS — J449 Chronic obstructive pulmonary disease, unspecified: Secondary | ICD-10-CM | POA: Diagnosis not present

## 2021-12-25 DIAGNOSIS — J9691 Respiratory failure, unspecified with hypoxia: Secondary | ICD-10-CM | POA: Diagnosis not present

## 2021-12-25 MED ORDER — BUDESONIDE-FORMOTEROL FUMARATE 80-4.5 MCG/ACT IN AERO: 2.0000 | INHALATION_SPRAY | Freq: Two times a day (BID) | RESPIRATORY_TRACT | 5 refills | Status: DC

## 2021-12-25 NOTE — Assessment & Plan Note (Signed)
Recent COPD exacerbation slowly improving.  Patient unable to tolerate triple therapy.  We will decrease down to dual therapy with Symbicort 80 twice daily. Activity as tolerated.  Plan  Patient Instructions  Stop BREZTRI.  Begin Symbicort 80/4.4mcg 2 puffs Twice daily  , rinse after use.  Mucinex DM Twice daily  As needed  cough/congestion  Albuterol inhaler As needed   Continue on Oxygen 2l/m with activity and At bedtime   Order for POC . Follow up in 4-6 weeks with Dr. Annamaria Boots  or Darrion Macaulay NP and As needed   Please contact office for sooner follow up if symptoms do not improve or worsen or seek emergency care

## 2021-12-25 NOTE — Assessment & Plan Note (Signed)
Recent COPD exacerbation now with exertional hypoxemia.  Patient oxygenation requirements are slightly decreased from last visit.  Patient is continue on oxygen 2 L with activity.  Order for POC sent to local DME.  Plan  Patient Instructions  Stop BREZTRI.  Begin Symbicort 80/4.23mcg 2 puffs Twice daily  , rinse after use.  Mucinex DM Twice daily  As needed  cough/congestion  Albuterol inhaler As needed   Continue on Oxygen 2l/m with activity and At bedtime   Order for POC . Follow up in 4-6 weeks with Dr. Maple Hudson  or Braleigh Massoud NP and As needed   Please contact office for sooner follow up if symptoms do not improve or worsen or seek emergency care

## 2021-12-25 NOTE — Progress Notes (Signed)
d  @Patient  ID: Alice Patton, female    DOB: 1951-01-25, 71 y.o.   MRN: [id number]  Chief Complaint  Patient presents with   Follow-up    Referring provider: Luanne Bras, PA  HPI: 70 year old female former smoker followed for mild COPD, chronic cough and lung nodules Medical history significant for coronary disease, atrial fibrillation on Eliquis  and diabetes  TEST/EVENTS :  PFT 12/13/2018- [facility]- Mild obstruction, Diffusion mildly reduced.FVC 2.83/ 103%, FEV1 1.98/ 101%, R 0.70, FEF25-75 1.26/ 56%, TLC 92%, DLCO 63% Lab- 01/16/19- Hypersens pneumonia Neg, Respiratory allergy Profile- elevated IgE for dust mite, cockroach, not for molds   CT chest 12/28/19- [facility]- Subtle scattered small pulmonary nodules, largest in lingula measuring 59mm, stable to less conspiculous   High-resolution CT chest January 24, 2021 showed mild diffuse subpleural reticular opacities and septal thickening suggestive of mild pulmonary fibrosis or changes of NSIP.  No honeycombing or architectural distortion.  Few scattered subpleural nodules that are unchanged compared to previous exams of favored benign etiology.  COPD changes  12/25/2021 Follow up : COPD  Patient returns for a 1 week follow-up.  Patient was treated for a slow to resolve COPD exacerbation.  She initially been seen by her primary care provider and given doxycycline and a prednisone taper.  Chest x-ray last week showed emphysema and chronic lower lobe fibrotic changes.  She was treated with a slow prednisone taper.  Change from Flovent to [place].  And started on oxygen at 2 L for exertional desaturations. Since last visit patient is feeling some better.  Patient says that she is unable to tolerate Judithann Sauger it makes her feel too nervous. Cough and congestion are better.Sinus symptoms and sore throat are resolved. No further fever. No hemoptysis , calf pain, n/v/d.  Today in the office patient continues to have desaturations with ambulation with O2  saturations at 83% on room air walking.  She required 2 L of oxygen to maintain O2 saturations greater than 88 to 90% on POC device.  Patient says she is got to have a POC to help with mobility she is unable to carry heavy tanks.   No Known Allergies  Immunization History  Administered Date(s) Administered   Fluad Quad(high Dose 65+) 07/18/2019   Influenza Split 09/14/2016, 09/09/2017, 10/10/2018   Influenza, High Dose Seasonal PF 09/14/2016, 09/09/2017, 10/10/2018, 07/18/2019, 09/23/2019   Influenza-Unspecified 08/27/2018   Pneumococcal Conjugate-13 01/26/2016   Pneumococcal-Unspecified 01/26/2016    Past Medical History:  Diagnosis Date   Allergy    Diabetes ([place])    Emphysema lung ([place])    Sinus trouble     Tobacco History: Social History   Tobacco Use  Smoking Status Former   Packs/day: 1.00   Years: 12.00   Pack years: 12.00   Types: Cigarettes   Quit date: 01/25/1994   Years since quitting: 27.9  Smokeless Tobacco Never   Counseling given: Not Answered   Outpatient Medications Prior to Visit  Medication Sig Dispense Refill   Albuterol Sulfate (PROAIR RESPICLICK) 123XX123 (90 Base) MCG/ACT AEPB Takes 1 puff every 4-6 hours as needed 3 each 4   apixaban (ELIQUIS) 5 MG TABS tablet      aspirin EC 81 MG tablet Take 81 mg by mouth daily.     atorvastatin (LIPITOR) 40 MG tablet Take by mouth.     benzonatate (TESSALON) 200 MG capsule Take 1 capsule (200 mg total) by mouth 3 (three) times daily as needed for cough. 30 capsule 1  diltiazem (CARDIZEM CD) 240 MG 24 hr capsule Take 240 mg by mouth daily.     flecainide (TAMBOCOR) 50 MG tablet Take 50 mg by mouth 2 (two) times daily.     Loratadine 10 MG CAPS Take 10 mg by mouth every morning.     metFORMIN (GLUCOPHAGE) 500 MG tablet Take 500 mg by mouth 2 (two) times daily with a meal.     predniSONE (DELTASONE) 10 MG tablet 2 tabs for 5 days, then 1 tab for 5 days, then stop 15 tablet 0   sertraline (ZOLOFT) 50 MG tablet  Take 50 mg by mouth daily.     Budeson-Glycopyrrol-Formoterol (BREZTRI AEROSPHERE) 160-9-4.8 MCG/ACT AERO Inhale 2 puffs into the lungs in the morning and at bedtime. 10.7 g 6   Budeson-Glycopyrrol-Formoterol (BREZTRI AEROSPHERE) 160-9-4.8 MCG/ACT AERO Inhale 2 puffs into the lungs in the morning and at bedtime. 10.7 g 5   No facility-administered medications prior to visit.     Review of Systems:   Constitutional:   No  weight loss, night sweats,  Fevers, chills,  +fatigue, or  lassitude.  HEENT:   No headaches,  Difficulty swallowing,  Tooth/dental problems, or  Sore throat,                No sneezing, itching, ear ache, nasal congestion, post nasal drip,   CV:  No chest pain,  Orthopnea, PND, swelling in lower extremities, anasarca, dizziness, palpitations, syncope.   GI  No heartburn, indigestion, abdominal pain, nausea, vomiting, diarrhea, change in bowel habits, loss of appetite, bloody stools.   Resp: Skin: no rash or lesions.  GU: no dysuria, change in color of urine, no urgency or frequency.  No flank pain, no hematuria   MS:  No joint pain or swelling.  No decreased range of motion.  No back pain.    Physical Exam  BP 110/60 (BP Location: Left Arm, Patient Position: Sitting, Cuff Size: Normal)    Pulse 90    Temp 98.1 F (36.7 C) (Oral)    Ht 5\' 2"  (1.575 m)    Wt 130 lb 3.2 oz (59.1 kg)    SpO2 94%    BMI 23.81 kg/m   GEN: A/Ox3; pleasant , NAD, elderly   HEENT:  [place]/AT,  EACs-clear, TMs-wnl, NOSE-clear, THROAT-clear, no lesions, no postnasal drip or exudate noted.   NECK:  Supple w/ fair ROM; no JVD; normal carotid impulses w/o bruits; no thyromegaly or nodules palpated; no lymphadenopathy.    RESP  Clear  P & A; w/o, wheezes/ rales/ or rhonchi. no accessory muscle use, no dullness to percussion  CARD:  RRR, no m/r/g, tr  peripheral edema, pulses intact, no cyanosis or clubbing.  GI:   Soft & nt; nml bowel sounds; no organomegaly or masses detected.   Musco:  Warm bil, no deformities or joint swelling noted.   Neuro: alert, no focal deficits noted.    Skin: Warm, no lesions or rashes    Lab Results:  CBC  BMET No results found for: NA, K, CL, CO2, GLUCOSE, BUN, CREATININE, CALCIUM, GFRNONAA, GFRAA  BNP No results found for: BNP  ProBNP No results found for: PROBNP  Imaging: DG Chest 2 View  Result Date: 12/18/2021 CLINICAL DATA:  COPD exacerbation EXAM: CHEST - 2 VIEW COMPARISON:  CT chest dated December 12, 2018 FINDINGS: The heart size and mediastinal contours are within normal limits. Advanced emphysematous changes prominent in the upper lobes. Bilateral lower lobe fibrotic changes. No focal consolidation or  pleural effusion. No appreciable pneumothorax. The visualized skeletal structures are unremarkable. IMPRESSION: COPD without evidence of acute cardiopulmonary process. Electronically Signed   By: Keane Police D.O.   On: 12/18/2021 15:03      No flowsheet data found.  No results found for: NITRICOXIDE      Assessment & Plan:   COPD mixed type ([place]) Recent COPD exacerbation slowly improving.  Patient unable to tolerate triple therapy.  We will decrease down to dual therapy with Symbicort 80 twice daily. Activity as tolerated.  Plan  Patient Instructions  Stop BREZTRI.  Begin Symbicort 80/4.47mcg 2 puffs Twice daily  , rinse after use.  Mucinex DM Twice daily  As needed  cough/congestion  Albuterol inhaler As needed   Continue on Oxygen 2l/m with activity and At bedtime   Order for POC . Follow up in 4-6 weeks with Dr. Annamaria Boots  or Arlett Goold NP and As needed   Please contact office for sooner follow up if symptoms do not improve or worsen or seek emergency care        Respiratory failure [facility]) Recent COPD exacerbation now with exertional hypoxemia.  Patient oxygenation requirements are slightly decreased from last visit.  Patient is continue on oxygen 2 L with activity.  Order for POC sent to local DME.  Plan   Patient Instructions  Stop BREZTRI.  Begin Symbicort 80/4.69mcg 2 puffs Twice daily  , rinse after use.  Mucinex DM Twice daily  As needed  cough/congestion  Albuterol inhaler As needed   Continue on Oxygen 2l/m with activity and At bedtime   Order for POC . Follow up in 4-6 weeks with Dr. Annamaria Boots  or Envy Meno NP and As needed   Please contact office for sooner follow up if symptoms do not improve or worsen or seek emergency care          Rexene Edison, NP 12/25/2021

## 2021-12-25 NOTE — Patient Instructions (Addendum)
Stop BREZTRI.  Begin Symbicort 80/4.5mcg 2 puffs Twice daily  , rinse after use.  Mucinex DM Twice daily  As needed  cough/congestion  Albuterol inhaler As needed   Continue on Oxygen 2l/m with activity and At bedtime   Order for POC . Follow up in 4-6 weeks with Dr. Annamaria Boots  or Sebastien Jackson NP and As needed   Please contact office for sooner follow up if symptoms do not improve or worsen or seek emergency care

## 2021-12-29 ENCOUNTER — Telehealth: Payer: Self-pay | Admitting: Adult Health

## 2021-12-29 NOTE — Telephone Encounter (Signed)
Patient stated she was told her insurance will not cover Symbicort, but will cover Advair.  Patient is wanting Advair prescription sent to [facility].   Message routed to [facility], NP to advise   LOV 12/25/21-  Stop BREZTRI.  Begin Symbicort 80/4.6mcg 2 puffs Twice daily  , rinse after use.  Mucinex DM Twice daily  As needed  cough/congestion  Albuterol inhaler As needed   Continue on Oxygen 2l/m with activity and At bedtime   Order for POC . Follow up in 4-6 weeks with Dr. Maple Hudson  or [place] NP and As needed   Please contact office for sooner follow up if symptoms do not improve or worsen or seek emergency care

## 2021-12-30 MED ORDER — ADVAIR HFA 115-21 MCG/ACT IN AERO: 2.0000 | INHALATION_SPRAY | Freq: Two times a day (BID) | RESPIRATORY_TRACT | 12 refills | Status: DC

## 2021-12-30 NOTE — Telephone Encounter (Signed)
Okay that is fine  Advair 115 HFA rx sent

## 2021-12-30 NOTE — Telephone Encounter (Signed)
I caled the patient and she is aware that the provider sent in a refill of the medication and she did not have any questions.

## 2021-12-31 ENCOUNTER — Other Ambulatory Visit (HOSPITAL_COMMUNITY): Payer: Self-pay

## 2022-01-14 ENCOUNTER — Ambulatory Visit (INDEPENDENT_AMBULATORY_CARE_PROVIDER_SITE_OTHER): Payer: Medicare Other | Admitting: Student

## 2022-01-14 ENCOUNTER — Other Ambulatory Visit: Payer: Self-pay | Admitting: Student

## 2022-01-14 ENCOUNTER — Encounter: Payer: Self-pay | Admitting: Student

## 2022-01-14 ENCOUNTER — Other Ambulatory Visit: Payer: Self-pay

## 2022-01-14 VITALS — BP 116/64 | HR 96 | Temp 98.7°F | Ht 62.0 in | Wt 124.2 lb

## 2022-01-14 DIAGNOSIS — J8410 Pulmonary fibrosis, unspecified: Secondary | ICD-10-CM | POA: Diagnosis not present

## 2022-01-14 DIAGNOSIS — J441 Chronic obstructive pulmonary disease with (acute) exacerbation: Secondary | ICD-10-CM | POA: Diagnosis not present

## 2022-01-14 DIAGNOSIS — J439 Emphysema, unspecified: Secondary | ICD-10-CM

## 2022-01-14 DIAGNOSIS — J019 Acute sinusitis, unspecified: Secondary | ICD-10-CM

## 2022-01-14 DIAGNOSIS — J449 Chronic obstructive pulmonary disease, unspecified: Secondary | ICD-10-CM

## 2022-01-14 DIAGNOSIS — B9689 Other specified bacterial agents as the cause of diseases classified elsewhere: Secondary | ICD-10-CM

## 2022-01-14 MED ORDER — AMOXICILLIN-POT CLAVULANATE 875-125 MG PO TABS: 1.0000 | ORAL_TABLET | Freq: Two times a day (BID) | ORAL | 0 refills | Status: DC

## 2022-01-14 MED ORDER — PREDNISONE 10 MG PO TABS: ORAL_TABLET | ORAL | 0 refills | Status: DC

## 2022-01-14 NOTE — Progress Notes (Signed)
? ?Synopsis: Referred for hypoxia by Carolan Clines, PA ? ?Subjective:  ? ?PATIENT ID: Alice Patton GENDER: female DOB: 1951-10-27, MRN: [id number] ? ?Chief Complaint  ?Patient presents with  ? Follow-up  ?  Pt states she is about the same since last visit. States she began coughing about a week ago and states she will get up phlegm that is mostly clear but has gotten up some green phlegm. Also states she has green postnasal drainage and states her head has been stopped up. States she has been having a time keeping O2 sats up but states that they have been above 90% mostly but has gotten as low as 83% on her O2.  ? ?71yF with CPFE, chronic hypoxemic respiratory failure on 3L O2 per [place] at home ? ?She says today she's coughing quite a bit, has been going on since last week. Still has sinus congestion that's bothersome. No fever. Mostly clear sputum but mucus from nose is green. She isn't feeling more dyspneic necessarily than usual but her O2 is a bit lower.  ? ?Typically she has productive cough in the morning.  ? ?DME supplier is [employer] ? ?Otherwise pertinent review of systems is negative. ? ?Past Medical History:  ?Diagnosis Date  ? Allergy   ? Diabetes ([facility])   ? Emphysema lung ([facility])   ? Sinus trouble   ?  ? ?Family History  ?Problem Relation Age of Onset  ? Cancer Maternal Grandmother   ?  ? ?Past Surgical History:  ?Procedure Laterality Date  ? NASAL SINUS SURGERY    ? TUBAL LIGATION    ? ? ?Social History  ? ?Socioeconomic History  ? Marital status: Married  ?  Spouse name: Not on file  ? Number of children: Not on file  ? Years of education: Not on file  ? Highest education level: Not on file  ?Occupational History  ? Not on file  ?Tobacco Use  ? Smoking status: Former  ?  Packs/day: 1.00  ?  Years: 12.00  ?  Pack years: 12.00  ?  Types: Cigarettes  ?  Quit date: 01/25/1994  ?  Years since quitting: 27.9  ? Smokeless tobacco: Never  ?Vaping Use  ? Vaping Use: Never used  ?Substance and Sexual Activity  ? Alcohol  use: Never  ? Drug use: Never  ? Sexual activity: Not on file  ?Other Topics Concern  ? Not on file  ?Social History Narrative  ? Not on file  ? ?Social Determinants of Health  ? ?Financial Resource Strain: Not on file  ?Food Insecurity: Not on file  ?Transportation Needs: Not on file  ?Physical Activity: Not on file  ?Stress: Not on file  ?Social Connections: Not on file  ?Intimate Partner Violence: Not on file  ?  ? ?No Known Allergies  ? ?Outpatient Medications Prior to Visit  ?Medication Sig Dispense Refill  ? Albuterol Sulfate (PROAIR RESPICLICK) 108 (90 Base) MCG/ACT AEPB Takes 1 puff every 4-6 hours as needed 3 each 4  ? apixaban (ELIQUIS) 5 MG TABS tablet     ? aspirin EC 81 MG tablet Take 81 mg by mouth daily.    ? atorvastatin (LIPITOR) 40 MG tablet Take by mouth.    ? benzonatate (TESSALON) 200 MG capsule Take 1 capsule (200 mg total) by mouth 3 (three) times daily as needed for cough. 30 capsule 1  ? diltiazem (CARDIZEM CD) 240 MG 24 hr capsule Take 240 mg by mouth daily.    ?  flecainide (TAMBOCOR) 50 MG tablet Take 50 mg by mouth 2 (two) times daily.    ? fluticasone-salmeterol (ADVAIR HFA) 115-21 MCG/ACT inhaler Inhale 2 puffs into the lungs 2 (two) times daily. 1 each 12  ? Loratadine 10 MG CAPS Take 10 mg by mouth every morning.    ? metFORMIN (GLUCOPHAGE) 500 MG tablet Take 500 mg by mouth 2 (two) times daily with a meal.    ? sertraline (ZOLOFT) 50 MG tablet Take 50 mg by mouth daily.    ? predniSONE (DELTASONE) 10 MG tablet 2 tabs for 5 days, then 1 tab for 5 days, then stop 15 tablet 0  ? ?No facility-administered medications prior to visit.  ? ? ? ? ? ?Objective:  ? ?Physical Exam: ? ?General appearance: 71 y.o., female, NAD, conversant  ?Eyes: anicteric sclerae; PERRL, tracking appropriately ?HENT: NCAT; MMM ?Neck: Trachea midline; no lymphadenopathy, no JVD ?Lungs: rhonchi and wheeze bl, with normal respiratory effort ?CV: RRR, no murmur  ?Abdomen: Soft, non-tender; non-distended, BS present   ?Extremities: No peripheral edema, warm ?Skin: Normal turgor and texture; no rash ?Psych: Appropriate affect ?Neuro: Alert and oriented to person and place, no focal deficit  ? ? ? ?Vitals:  ? 01/14/22 1335 01/14/22 1339  ?BP: 116/64   ?Pulse:  96  ?Temp: 98.7 ?F (37.1 ?C)   ?TempSrc: Oral   ?SpO2: (!) 88% 95%  ?Weight: 124 lb 3.2 oz (56.3 kg)   ?Height: 5\' 2"  (1.575 m)   ? ?95% on 2 LPM  ?BMI Readings from Last 3 Encounters:  ?01/14/22 22.72 kg/m?  ?12/25/21 23.81 kg/m?  ?12/18/21 22.99 kg/m?  ? ?Wt Readings from Last 3 Encounters:  ?01/14/22 124 lb 3.2 oz (56.3 kg)  ?12/25/21 130 lb 3.2 oz (59.1 kg)  ?12/18/21 129 lb 12.8 oz (58.9 kg)  ? ? ? ?CBC ?   ?Component Value Date/Time  ? WBC 6.6 01/26/2019 1531  ? RBC 4.75 01/26/2019 1531  ? HGB 13.4 01/26/2019 1531  ? HCT 39.3 01/26/2019 1531  ? PLT 222.0 01/26/2019 1531  ? MCV 82.8 01/26/2019 1531  ? MCHC 34.0 01/26/2019 1531  ? RDW 13.1 01/26/2019 1531  ? LYMPHSABS 1.5 01/26/2019 1531  ? MONOABS 0.4 01/26/2019 1531  ? EOSABS 0.1 01/26/2019 1531  ? BASOSABS 0.1 01/26/2019 1531  ? ? ?Chest Imaging: ?CXR 12/18/21 without pneumonia  ? ?Pulmonary Functions Testing Results: ?No flowsheet data found. ? ? ?   ?Assessment & Plan:  ? ?# Acute exacerbation of COPD ?# Combined pulmonary fibrosis and emphysema (CPFE) ? ?# Acute bacterial sinusitis ? ?Plan: ?- augmentin BID for 7d ?- prednisone taper ?- continue advair 115 2 puffs BID, intolerant of triple therapy in past due to anxiety ?- start flutter valve 10 slow but firm puffs twice daily   ? ?RTC with Dr. 02/15/22 next week ? ?Maple Hudson, MD ?[facility] ?01/14/2022 1:49 PM  ? ?

## 2022-01-14 NOTE — Patient Instructions (Addendum)
-   augmentin for 1 week for sinus infection/COPD exacerbation ?- prednisone taper  ?- keep using advair 2 puffs twice daily ?- after each advair treatment, start flutter valve 10 slow but firm puffs twice daily  ?- Dr. Maple Hudson will see you next week! ? ?

## 2022-01-16 NOTE — Progress Notes (Signed)
HPI ?F former smoker followed for COPD.chronic Cough, Lung Nodules, CAD/ Afib, GERD, DM2,  ?PFT 12/13/2018- [facility]- Mild obstruction, Diffusion mildly reduced.FVC 2.83/ 103%, FEV1 1.98/ 101%, R 0.70, FEF25-75 1.26/ 56%, TLC 92%, DLCO 63% ?Lab- 01/16/19- Hypersens pneumonia Neg, Respiratory allergy Profile- elevated IgE for dust mite, cockroach, not for molds ?------------------------------------------------------------. ? ? ?01/16/21- 17 yoF former smoker followed for COPD.chronic Cough, Lung Nodules, CAD, AFib cardioverted, GERD, DM2,  ?-Albuterol hfa, Flovent 110,  Sym bicort 80,  ?Still [facility] at [facility]?? Says not. ?Covid vax-none ?Flu vax-none ?Slight morning cough, scant phlegm. Otherwise doing well since cardioversion. ?Discussed updating CT for lung nodule status in this former smoker. ? ?01/19/22- 61 yoF former smoker (12 pkyrs) followed for COPD.chronic Cough, Lung Nodules, CAD, AFib cardioverted/ Eliquis, GERD, DM2,  ?O2 2-3L sleep and activity/ [employer]. Ordered POC ?-Albuterol hfa, Advair 115-21 HFA,  ? NP TP 2/3 _doxy and pred and ordered POC ? Dr Verlee Monte OV 3/1-Started prednisone taper and augmentin 3/1 dx AECOPD, Pulm Fibrosis   ?Covid vax-no ?Flu vax- no ?EKG 01/19/22 WNL- done for rapid pulse, weakness, hypoxia and hx AFib. ?O2 sats keep dropping into mid 80s. She denies chest pain, palpitation, leg pain/cramps swelling and remains on Eliquis. ?Flutter device helps some.  ?She actively requests to stay on prednisone 20 mg daily and to extend antibiotic, saying last 3 days are first time she has felt a little better since onset of accute illness in January. Side effects/ potential complications reviewed.  ?CXR 12/18/21- ?FINDINGS: ?The heart size and mediastinal contours are within normal limits. ?Advanced emphysematous changes prominent in the upper lobes. ?Bilateral lower lobe fibrotic changes. No focal consolidation or ?pleural effusion. No appreciable pneumothorax. The visualized ?skeletal structures  are unremarkable. ?IMPRESSION: ?COPD without evidence of acute cardiopulmonary process. ? ?ROS-see HPI   + = positive ?Constitutional:    weight loss, night sweats, fevers, chills, fatigue, lassitude. ?HEENT:    headaches, +difficulty swallowing, tooth/dental problems, sore throat,  ?     sneezing, itching, ear ache, nasal congestion, post nasal drip, snoring ?CV:    chest pain, orthopnea, PND, swelling in lower extremities, anasarca,                                   ?dizziness, palpitations ?Resp:   +shortness of breath with exertion or at rest.   ?             +productive cough,   non-productive cough, coughing up of blood.   ?           change in color of mucus.  wheezing.   ?Skin:    rash or lesions. ?GI:  No-   heartburn, indigestion, abdominal pain, nausea, vomiting, diarrhea,  ?               change in bowel habits, loss of appetite ?GU: dysuria, change in color of urine, no urgency or frequency.   flank pain. ?MS:   joint pain, stiffness, decreased range of motion, back pain. ?Neuro-     nothing unusual ?Psych:  change in mood or affect.  depression or anxiety.   memory loss. ? ?OBJ- Physical Exam ?General- Alert, Oriented, Affect-appropriate, Distress- none acute ?Skin- rash-none, lesions- none, excoriation- none ?Lymphadenopathy- none ?Head- atraumatic ?           Eyes- Gross vision intact, PERRLA, conjunctivae and secretions clear ?  Ears- Hearing, canals-normal ?           Nose- Clear, no-Septal dev, mucus, polyps, erosion, perforation  ?           Throat- Mallampati II , mucosa clear , drainage- none, tonsils- atrophic ?Neck- flexible , trachea midline, no stridor , thyroid nl, carotid no bruit ?Chest - symmetrical excursion , unlabored ?          Heart/CV- RRR , no murmur , no gallop  , no rub, nl s1 s2 ?                          - JVD+1cm , edema- none, stasis changes- none, varices- none ?          Lung- clear to P&A, wheeze- none, cough- none , dullness-none, rub- none ?          Chest  wall-  ?Abd-  ?Br/ Gen/ Rectal- Not done, not indicated ?Extrem- cyanosis- none, clubbing, none, atrophy- none, strength- nl ?Neuro- grossly intact to observation ? ? ?

## 2022-01-19 ENCOUNTER — Other Ambulatory Visit: Payer: Self-pay

## 2022-01-19 ENCOUNTER — Encounter: Payer: Self-pay | Admitting: Internal Medicine

## 2022-01-19 ENCOUNTER — Other Ambulatory Visit (INDEPENDENT_AMBULATORY_CARE_PROVIDER_SITE_OTHER): Payer: Medicare Other

## 2022-01-19 ENCOUNTER — Ambulatory Visit (INDEPENDENT_AMBULATORY_CARE_PROVIDER_SITE_OTHER): Payer: Medicare Other | Admitting: Internal Medicine

## 2022-01-19 VITALS — BP 122/82 | HR 99 | Temp 97.7°F | Ht 63.0 in | Wt 125.6 lb

## 2022-01-19 DIAGNOSIS — J0140 Acute pansinusitis, unspecified: Secondary | ICD-10-CM | POA: Diagnosis not present

## 2022-01-19 DIAGNOSIS — R918 Other nonspecific abnormal finding of lung field: Secondary | ICD-10-CM

## 2022-01-19 DIAGNOSIS — J449 Chronic obstructive pulmonary disease, unspecified: Secondary | ICD-10-CM | POA: Diagnosis not present

## 2022-01-19 DIAGNOSIS — R0609 Other forms of dyspnea: Secondary | ICD-10-CM | POA: Diagnosis not present

## 2022-01-19 DIAGNOSIS — J9691 Respiratory failure, unspecified with hypoxia: Secondary | ICD-10-CM | POA: Diagnosis not present

## 2022-01-19 LAB — CBC WITH DIFFERENTIAL/PLATELET
Basophils Absolute: 0 10*3/uL (ref 0.0–0.1)
Basophils Relative: 0.2 % (ref 0.0–3.0)
Eosinophils Absolute: 0 10*3/uL (ref 0.0–0.7)
Eosinophils Relative: 0 % (ref 0.0–5.0)
HCT: 37.2 % (ref 36.0–46.0)
Hemoglobin: 12.3 g/dL (ref 12.0–15.0)
Lymphocytes Relative: 10.9 % — ABNORMAL LOW (ref 12.0–46.0)
Lymphs Abs: 1 10*3/uL (ref 0.7–4.0)
MCHC: 33.1 g/dL (ref 30.0–36.0)
MCV: 82.3 fl (ref 78.0–100.0)
Monocytes Absolute: 0.3 10*3/uL (ref 0.1–1.0)
Monocytes Relative: 3 % (ref 3.0–12.0)
Neutro Abs: 8.2 10*3/uL — ABNORMAL HIGH (ref 1.4–7.7)
Neutrophils Relative %: 85.9 % — ABNORMAL HIGH (ref 43.0–77.0)
Platelets: 308 10*3/uL (ref 150.0–400.0)
RBC: 4.52 Mil/uL (ref 3.87–5.11)
RDW: 14.6 % (ref 11.5–15.5)
WBC: 9.5 10*3/uL (ref 4.0–10.5)

## 2022-01-19 LAB — COMPREHENSIVE METABOLIC PANEL
ALT: 15 U/L (ref 0–35)
AST: 16 U/L (ref 0–37)
Albumin: 4.2 g/dL (ref 3.5–5.2)
Alkaline Phosphatase: 60 U/L (ref 39–117)
BUN: 16 mg/dL (ref 6–23)
CO2: 26 mEq/L (ref 19–32)
Calcium: 9.2 mg/dL (ref 8.4–10.5)
Chloride: 99 mEq/L (ref 96–112)
Creatinine, Ser: 0.67 mg/dL (ref 0.40–1.20)
GFR: 87.24 mL/min (ref >60.00)
Glucose, Bld: 216 mg/dL — ABNORMAL HIGH (ref 70–99)
Potassium: 3.8 mEq/L (ref 3.5–5.1)
Sodium: 137 mEq/L (ref 135–145)
Total Bilirubin: 0.7 mg/dL (ref 0.2–1.2)
Total Protein: 7.4 g/dL (ref 6.0–8.3)

## 2022-01-19 MED ORDER — PREDNISONE 10 MG PO TABS: ORAL_TABLET | ORAL | 1 refills | Status: DC

## 2022-01-19 MED ORDER — AMOXICILLIN-POT CLAVULANATE 875-125 MG PO TABS: 1.0000 | ORAL_TABLET | Freq: Two times a day (BID) | ORAL | 0 refills | Status: AC

## 2022-01-19 NOTE — Assessment & Plan Note (Addendum)
Plan- HC Parking, extend augmentin 1 more week, extend prednisone at 20 mg daily, dropping to 15 mg when she can. Side efffects discussion Labs for CBC, CMET, Ddimer, BNP ?

## 2022-01-19 NOTE — Patient Instructions (Addendum)
[facility]    dx COPD, ILD ?                                        -also- CT maxillofacial limited    dx acute pansinusitis ? ?Order- lab- CBC w diff, CMET, D-dimer, BNP    dx dyspnea on exertion, Acute hypoxic respiratory failure ? ?Script sent for prednisone to continue 20 mg daily. Try to get down to 1.5 tabs ( 15 mg) when you can. ? ?Script sent for another week of augmentin antibiotic ? ?Continue O2 2-3L/minute for sleep and activity ? ? ?

## 2022-01-19 NOTE — Assessment & Plan Note (Signed)
Feels real relief on O2  ?Plan- continue O2 2-3L. HC Parking,  ?

## 2022-01-20 LAB — D-DIMER, QUANTITATIVE: D-Dimer, Quant: 0.31 mcg/mL FEU (ref <0.50)

## 2022-01-20 LAB — PRO B NATRIURETIC PEPTIDE: NT-Pro BNP: 522 pg/mL — ABNORMAL HIGH (ref 0–301)

## 2022-01-20 NOTE — Progress Notes (Signed)
Spoke with pt and she voices understanding. Pt did not have any further questions.

## 2022-02-13 ENCOUNTER — Ambulatory Visit
Admission: RE | Admit: 2022-02-13 | Discharge: 2022-02-13 | Disposition: A | Discharge status: Home / Self Care | Payer: Medicare Other | Source: Ambulatory Visit | Attending: Internal Medicine | Admitting: Internal Medicine

## 2022-02-13 DIAGNOSIS — J0140 Acute pansinusitis, unspecified: Secondary | ICD-10-CM

## 2022-02-13 DIAGNOSIS — R0609 Other forms of dyspnea: Secondary | ICD-10-CM

## 2022-02-17 ENCOUNTER — Telehealth: Payer: Self-pay

## 2022-02-17 NOTE — Telephone Encounter (Signed)
Does your statement mean she is asking for an appointment with me? Ok to use one of my held spots to see me in next couple of weeks. ?

## 2022-02-17 NOTE — Telephone Encounter (Signed)
She did not mention an appt she states she has an upcoming appt with you next Tuesday. Sorry for the miscommunication.  ?

## 2022-02-17 NOTE — Telephone Encounter (Signed)
Called and spoke to patient about CT results. patient states she still has an Ear infection that her ENT is sending something in for her, states that he recommends tubes for her ears, states her O2 has still been dropping.  ?

## 2022-02-23 NOTE — Progress Notes (Signed)
HPI ?F former smoker followed for COPD.chronic Cough, Lung Nodules, CAD/ Afib, GERD, DM2,  ?PFT 12/13/2018- [facility]- Mild obstruction, Diffusion mildly reduced.FVC 2.83/ 103%, FEV1 1.98/ 101%, R 0.70, FEF25-75 1.26/ 56%, TLC 92%, DLCO 63% ?Lab- 01/16/19- Hypersens pneumonia Neg, Respiratory allergy Profile- elevated IgE for dust mite, cockroach, not for molds ?------------------------------------------------------------. ? ? ?01/19/22- 70 yoF former smoker (12 pkyrs) followed for COPD.chronic Cough, Lung Nodules, CAD, AFib cardioverted/ Eliquis, GERD, DM2,  ?O2 2-3L sleep and activity/ [employer]. Ordered POC ?-Albuterol hfa, Advair 115-21 HFA,  ? NP TP 2/3 _doxy and pred and ordered POC ? Dr Verlee Monte OV 3/1-Started prednisone taper and augmentin 3/1 dx AECOPD, Pulm Fibrosis   ?Covid vax-no ?Flu vax- no ?EKG 01/19/22 WNL- done for rapid pulse, weakness, hypoxia and hx AFib. ?O2 sats keep dropping into mid 80s. She denies chest pain, palpitation, leg pain/cramps swelling and remains on Eliquis. ?Flutter device helps some.  ?She actively requests to stay on prednisone 20 mg daily and to extend antibiotic, saying last 3 days are first time she has felt a little better since onset of accute illness in January. Side effects/ potential complications reviewed.  ?CXR 12/18/21- ?FINDINGS: ?The heart size and mediastinal contours are within normal limits. ?Advanced emphysematous changes prominent in the upper lobes. ?Bilateral lower lobe fibrotic changes. No focal consolidation or ?pleural effusion. No appreciable pneumothorax. The visualized ?skeletal structures are unremarkable. ?IMPRESSION: ?COPD without evidence of acute cardiopulmonary process. ? ?02/24/22- 5 yoF former smoker (12 pkyrs) followed for COPD, ILD/UIP, Chronic Hypoxic Respiratory Failure, Lung Nodules, complicated by CAD, AFib cardioverted/ Eliquis, GERD, DM2,  ?O2 2-4L sleep and activity/ [employer]. Ordered POC ?-Albuterol hfa, Advair 115-21 HFA, prednisone 10 mgx2 daily  since last visit,  ?Lab- 3/6- BNP 522 ?Has seen [facility] for chronic serous otitis media eustachian dysfunction, allergic rhinitis> Flonase, took Augmentin and prednisone ?Covid vax- no ?Flu vax-no ?Cannot hear much of anything through right ear and will probably end up needing tympanostomy tube by her ENT. ?Breathing is stable.  She asks refill Tessalon Perles but has little cough currently after Augmentin.  Remains dependent on oxygen usually at 4 L pulse. ?We reviewed her CT scan, discussing ILD, potentially UIP.  She accepted my recommendation that we refer her to one of our ILD experts for consultation and management of that problem as appropriate.  She wants to continue to follow with me for general pulmonary. ?HRCT 02/14/22- ?IMPRESSION: ?1. Mild pulmonary fibrosis in a pattern with apical to basal ?gradient, featuring irregular peripheral interstitial opacity, ?septal thickening, and small areas of subpleural bronchiolectasis at ?the lung bases, findings not significantly changed compared to ?immediate prior examination dated 01/24/2021, however slightly ?worsened over a longer period of time dating back to 06/09/2017. ?Findings are categorized as probable UIP per consensus guidelines: ?Diagnosis of Idiopathic Pulmonary Fibrosis: An Official ?ATS/ERS/JRS/ALAT Clinical Practice Guideline. [place] ?Med Vol 198, Iss 5, [id number], Jul 17 2017. ?2. Emphysema. ?3. Coronary artery disease. ?4. Enlargement of the main pulmonary artery, as can be seen in ?pulmonary hypertension. ?  ?Aortic Atherosclerosis (ICD10-I70.0) and Emphysema (ICD10-J43.9). ? ?ROS-see HPI   + = positive ?Constitutional:    weight loss, night sweats, fevers, chills, fatigue, lassitude. ?HEENT:    headaches, +difficulty swallowing, tooth/dental problems, sore throat,  ?     sneezing, itching, ear ache, nasal congestion, post nasal drip, snoring ?CV:    chest pain, orthopnea, PND, swelling in lower extremities, anasarca,                                    ?  dizziness, palpitations ?Resp:   +shortness of breath with exertion or at rest.   ?             +productive cough,   non-productive cough, coughing up of blood.   ?           change in color of mucus.  wheezing.   ?Skin:    rash or lesions. ?GI:  No-   heartburn, indigestion, abdominal pain, nausea, vomiting, diarrhea,  ?               change in bowel habits, loss of appetite ?GU: dysuria, change in color of urine, no urgency or frequency.   flank pain. ?MS:   joint pain, stiffness, decreased range of motion, back pain. ?Neuro-     nothing unusual ?Psych:  change in mood or affect.  depression or anxiety.   memory loss. ? ?OBJ- Physical Exam ?General- Alert, Oriented, Affect-appropriate, Distress- none acute ?Skin- rash-none, lesions- none, excoriation- none ?Lymphadenopathy- none ?Head- atraumatic ?           Eyes- Gross vision intact, PERRLA, conjunctivae and secretions clear ?           Ears- Hearing, canals-normal ?           Nose- Clear, no-Septal dev, mucus, polyps, erosion, perforation  ?           Throat- Mallampati II , mucosa clear , drainage- none, tonsils- atrophic ?Neck- flexible , trachea midline, no stridor , thyroid nl, carotid no bruit ?Chest - symmetrical excursion , unlabored ?          Heart/CV- RRR , no murmur , no gallop  , no rub, nl s1 s2 ?                          - JVD+1cm , edema- none, stasis changes- none, varices- none ?          Lung- clear to P&A, wheeze- none, cough- none , dullness-none, rub- none ?          Chest wall-  ?Abd-  ?Br/ Gen/ Rectal- Not done, not indicated ?Extrem- cyanosis- none, clubbing, none, atrophy- none, strength- nl ?Neuro- grossly intact to observation ? ? ?

## 2022-02-23 NOTE — Assessment & Plan Note (Signed)
Will update HRCT ?

## 2022-02-23 NOTE — Progress Notes (Deleted)
HPI ?F former smoker followed for COPD.chronic Cough, Lung Nodules, CAD/ Afib, GERD, DM2,  ?PFT 12/13/2018- [facility]- Mild obstruction, Diffusion mildly reduced.FVC 2.83/ 103%, FEV1 1.98/ 101%, R 0.70, FEF25-75 1.26/ 56%, TLC 92%, DLCO 63% ?Lab- 01/16/19- Hypersens pneumonia Neg, Respiratory allergy Profile- elevated IgE for dust mite, cockroach, not for molds ?------------------------------------------------------------. ? ? ?01/19/22- 8 yoF former smoker (12 pkyrs) followed for COPD.chronic Cough, Lung Nodules, CAD, AFib cardioverted/ Eliquis, GERD, DM2,  ?O2 2-3L sleep and activity/ [employer]. Ordered POC ?-Albuterol hfa, Advair 115-21 HFA,  ? NP TP 2/3 _doxy and pred and ordered POC ? Dr Thora Lance OV 3/1-Started prednisone taper and augmentin 3/1 dx AECOPD, Pulm Fibrosis   ?Covid vax-no ?Flu vax- no ?EKG 01/19/22 WNL- done for rapid pulse, weakness, hypoxia and hx AFib. ?O2 sats keep dropping into mid 80s. She denies chest pain, palpitation, leg pain/cramps swelling and remains on Eliquis. ?Flutter device helps some.  ?She actively requests to stay on prednisone 20 mg daily and to extend antibiotic, saying last 3 days are first time she has felt a little better since onset of accute illness in January. Side effects/ potential complications reviewed.  ?CXR 12/18/21- ?FINDINGS: ?The heart size and mediastinal contours are within normal limits. ?Advanced emphysematous changes prominent in the upper lobes. ?Bilateral lower lobe fibrotic changes. No focal consolidation or ?pleural effusion. No appreciable pneumothorax. The visualized ?skeletal structures are unremarkable. ?IMPRESSION: ?COPD without evidence of acute cardiopulmonary process. ? ?02/24/22- 6 yoF former smoker (12 pkyrs) followed for COPD, ILD, chronic Cough, Lung Nodules, complicated by  CAD, AFib cardioverted/ Eliquis, GERD, DM2,  ?O2 2-3L sleep and activity/ [employer]. Ordered POC ?-Albuterol hfa, Advair 115-21 HFA, prednisone 10 mgx2 daily,  ? ?HRCT  02/14/22- ?IMPRESSION: ?1. Mild pulmonary fibrosis in a pattern with apical to basal ?gradient, featuring irregular peripheral interstitial opacity, ?septal thickening, and small areas of subpleural bronchiolectasis at ?the lung bases, findings not significantly changed compared to ?immediate prior examination dated 01/24/2021, however slightly ?worsened over a longer period of time dating back to 06/09/2017. ?Findings are categorized as probable UIP per consensus guidelines: ?Diagnosis of Idiopathic Pulmonary Fibrosis: An Official ?ATS/ERS/JRS/ALAT Clinical Practice Guideline. Am Rosezetta Schlatter Crit Care ?Med Vol 198, Iss 5, [id number], Jul 17 2017. ?2. Emphysema. ?3. Coronary artery disease. ?4. Enlargement of the main pulmonary artery, as can be seen in ?pulmonary hypertension. ?Aortic Atherosclerosis (ICD10-I70.0) and Emphysema (ICD10-J43.9). ? ? ? ?ROS-see HPI   + = positive ?Constitutional:    weight loss, night sweats, fevers, chills, fatigue, lassitude. ?HEENT:    headaches, +difficulty swallowing, tooth/dental problems, sore throat,  ?     sneezing, itching, ear ache, nasal congestion, post nasal drip, snoring ?CV:    chest pain, orthopnea, PND, swelling in lower extremities, anasarca,                                   ?dizziness, palpitations ?Resp:   +shortness of breath with exertion or at rest.   ?             +productive cough,   non-productive cough, coughing up of blood.   ?           change in color of mucus.  wheezing.   ?Skin:    rash or lesions. ?GI:  No-   heartburn, indigestion, abdominal pain, nausea, vomiting, diarrhea,  ?  change in bowel habits, loss of appetite ?GU: dysuria, change in color of urine, no urgency or frequency.   flank pain. ?MS:   joint pain, stiffness, decreased range of motion, back pain. ?Neuro-     nothing unusual ?Psych:  change in mood or affect.  depression or anxiety.   memory loss. ? ?OBJ- Physical Exam ?General- Alert, Oriented, Affect-appropriate, Distress-  none acute ?Skin- rash-none, lesions- none, excoriation- none ?Lymphadenopathy- none ?Head- atraumatic ?           Eyes- Gross vision intact, PERRLA, conjunctivae and secretions clear ?           Ears- Hearing, canals-normal ?           Nose- Clear, no-Septal dev, mucus, polyps, erosion, perforation  ?           Throat- Mallampati II , mucosa clear , drainage- none, tonsils- atrophic ?Neck- flexible , trachea midline, no stridor , thyroid nl, carotid no bruit ?Chest - symmetrical excursion , unlabored ?          Heart/CV- RRR , no murmur , no gallop  , no rub, nl s1 s2 ?                          - JVD+1cm , edema- none, stasis changes- none, varices- none ?          Lung- clear to P&A, wheeze- none, cough- none , dullness-none, rub- none ?          Chest wall-  ?Abd-  ?Br/ Gen/ Rectal- Not done, not indicated ?Extrem- cyanosis- none, clubbing, none, atrophy- none, strength- nl ?Neuro- grossly intact to observation ? ? ?

## 2022-02-24 ENCOUNTER — Encounter: Payer: Self-pay | Admitting: Internal Medicine

## 2022-02-24 ENCOUNTER — Ambulatory Visit (INDEPENDENT_AMBULATORY_CARE_PROVIDER_SITE_OTHER): Payer: Medicare Other | Admitting: Internal Medicine

## 2022-02-24 ENCOUNTER — Ambulatory Visit: Payer: Medicare Other | Admitting: Internal Medicine

## 2022-02-24 DIAGNOSIS — J9611 Chronic respiratory failure with hypoxia: Secondary | ICD-10-CM

## 2022-02-24 DIAGNOSIS — J849 Interstitial pulmonary disease, unspecified: Secondary | ICD-10-CM

## 2022-02-24 DIAGNOSIS — J441 Chronic obstructive pulmonary disease with (acute) exacerbation: Secondary | ICD-10-CM | POA: Diagnosis not present

## 2022-02-24 DIAGNOSIS — J449 Chronic obstructive pulmonary disease, unspecified: Secondary | ICD-10-CM

## 2022-02-24 MED ORDER — BENZONATATE 200 MG PO CAPS: 200.0000 mg | ORAL_CAPSULE | Freq: Three times a day (TID) | ORAL | 3 refills | Status: AC | PRN

## 2022-02-24 NOTE — Patient Instructions (Addendum)
Order- referral to Dr Chase Caller or Dr Vaughan Browner   ILD consult ?      This doctor will talk with you about your pulmonary fibrosis (UIP) pattern on xray. ? ?I think you will feel better if you go ahead with the ENT doctor's plans for your ear ? ?Please call if we can help ?

## 2022-03-21 ENCOUNTER — Telehealth: Payer: Self-pay | Admitting: Pulmonary Disease

## 2022-03-21 NOTE — Telephone Encounter (Signed)
Patient called to complained of constant back pain for past 3 days. No cough, sputum or dyspnea. ?Offered to get a chest x ray in office but she declined it ? ?She has a recent diagnosis of mild prob UIP fibrosis and is due to see Dr. Marchelle Gearing later this month ?Looks like the pain is musculoskeletal ?Use tylenol and motrim OTC, topical pain patch ?She will call back if there is no improvement.  ? ?Chilton Greathouse MD ?[facility] ?03/21/2022, 11:24 AM  ? ?

## 2022-03-24 DIAGNOSIS — J849 Interstitial pulmonary disease, unspecified: Secondary | ICD-10-CM | POA: Insufficient documentation

## 2022-03-24 NOTE — Assessment & Plan Note (Signed)
O2 2-4L sleep and activity/ [employer]. Ordered POC ?

## 2022-03-24 NOTE — Assessment & Plan Note (Signed)
I have asked her to see one of our group experts on ILD and she has agreed. ?

## 2022-03-24 NOTE — Assessment & Plan Note (Signed)
Continues with bronchodilators.  We again discussed maintenance steroids and will try to get that dose down.  She has felt strongly she wanted to stay on prednisone despite side effect concerns. ?

## 2022-03-31 ENCOUNTER — Ambulatory Visit (INDEPENDENT_AMBULATORY_CARE_PROVIDER_SITE_OTHER): Payer: Medicare Other | Admitting: Internal Medicine

## 2022-03-31 ENCOUNTER — Telehealth: Payer: Self-pay | Admitting: Internal Medicine

## 2022-03-31 ENCOUNTER — Encounter: Payer: Self-pay | Admitting: Internal Medicine

## 2022-03-31 VITALS — BP 110/60 | HR 82 | Temp 97.8°F | Ht 63.0 in | Wt 128.4 lb

## 2022-03-31 DIAGNOSIS — R0602 Shortness of breath: Secondary | ICD-10-CM

## 2022-03-31 DIAGNOSIS — J439 Emphysema, unspecified: Secondary | ICD-10-CM | POA: Diagnosis not present

## 2022-03-31 DIAGNOSIS — J849 Interstitial pulmonary disease, unspecified: Secondary | ICD-10-CM | POA: Diagnosis not present

## 2022-03-31 DIAGNOSIS — J8410 Pulmonary fibrosis, unspecified: Secondary | ICD-10-CM

## 2022-03-31 DIAGNOSIS — J449 Chronic obstructive pulmonary disease, unspecified: Secondary | ICD-10-CM

## 2022-03-31 LAB — SEDIMENTATION RATE: Sed Rate: 7 mm/hr (ref 0–30)

## 2022-03-31 LAB — BRAIN NATRIURETIC PEPTIDE: Pro B Natriuretic peptide (BNP): 27 pg/mL (ref 0.0–100.0)

## 2022-03-31 NOTE — Telephone Encounter (Signed)
?  Alice Patton ? ?Pls call the [facility] and let the know that CT chest shows enlarged Pulm art and coronary calcification. Therfoere, we request  ? ?1 Right heart cath and if not atleast a echo to start with ?2. And ensure no CAD ? ?Thanks ? ? ? ?SIGNATURE  ? ? ?Dr. Kalman Shan, M.D., F.C.C.P,  ?Pulmonary and Critical Care Medicine ?Staff Physician, [facility] ?Center Director - Interstitial Lung Disease  Program  ?Medical Director - [facility] ?[facility]rosis [facility] ?[place], [place], [address] ? ?NPI Number:  NPI #[id number] ?DEA Number: [id number] ? ?Pager: [phone], If no answer  -> Check AMION or Try [phone] ?Telephone (clinical office): [phone] ?Telephone (research): [phone] ? ?12:13 PM ?03/31/2022 ? ?xxx ? ?Alice Patton, Alice Pearson, NP   ?[address]   ?[place], [place] [address]   ?Phone: [phone]   ?Fax: [phone]   ?

## 2022-03-31 NOTE — Patient Instructions (Addendum)
ICD-10-CM   ?1. ILD (interstitial lung disease) (HCC)  J84.9   ?  ?2. COPD mixed type (HCC)  J44.9   ?  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 J84.10   ?  ? ?#You have emphhysema which you know about ? ?Plan ? - continue prior inhaler therapy ?- check blood A1AT phenotype ? ? #You  have associated  Interstitial Lung Disease (ILD) or Pulmonary fibrosis (PF) ? -  There are MANY varieties of this ?- slowly progerssive 2018- > 2022 -> similar now to 2022 ? ?Plan ?- To narrow down possibilities and assess severity please do the following tests ? - do full PFT - assess quantity of progression (ie., 5-10%) to weigh in on anti-fibrotic ? - do autoimmune panel: Serum: ESR, ANA, DS-DNA, RF, anti-CCP, ssA, ssB, scl-70,  ?, Total CK,  Aldolase,  Hypersensitivity Pneumonitis Panel, QUantiferon Gold, and Alpha 1 AT phenotype ? - do ILD questionnaire at home and bring home ? - do walk test at next visit ? ?#Elarged Pulmonary ARtery on CT chest April 2023 ?Coronary artery calcifiation on CT April 2023 ? ?You might also have associated pulmonary hypertension ? ?Plan ? - check blood BNP ?- will let your cardiologist know to consider echo/heart cath as appropriate ? ?Followup ? - next few to several weeks but after completing above - with Dr Ramaswamy to discuss next steps ? - discussion around treatment v watching ? ? ? ?-  ? ?

## 2022-03-31 NOTE — Progress Notes (Signed)
? ? ? ? ?OV 03/31/2022 - -evaluation at the ILD center by Dr. Marchelle Gearingamaswamy.  Referred by Dr. Jetty Duhamellinton Young. ? ?Subjective:  ?Patient ID: Alice Patton, female , DOB: 1950/12/26 , age 71 y.o. , MRN: [id number] , [address]ESS: [address] ?[place] [id number] ?PCP Carolan ClinesSnider, Gina M, PA ?Patient Care Team: ?Carolan ClinesSnider, Gina M, [place] as PCP - General (Physician Assistant) ? ?This Provider for this visit: Treatment Team:  ?Attending Provider: Kalman Shanamaswamy, Stanislawa Gaffin, MD ? ? ? ?03/31/2022 -   ?Chief Complaint  ?Patient presents with  ? Follow-up  ?  Pt here to see MR per Dr. Maple HudsonYoung.  Pt states she does not have any complaints of SOB as she wears O2 24/7.  ? ? ? ?HPI ?Alice Patton [age] y.o. -accompanied by her son Marianna FussDustin Adams.  She is former smoker 12 packs.  She has COPD not otherwise specified [CT with emphysema/no prior PFT], [facility] and is on Eliquis, acid reflux, type 2 diabetes.  She typically uses oxygen for sleep.  There is also history of lung nodules not otherwise specified. ? ?History is gained by talking to the patient and review of the medical records. ? ?She tells me now that she is using 2 L nasal cannula continuously for 2 months although today 94% room air x 15 min at rest.  She says that sometime in January 2023 she had a sinus infection saw primary care nurse practitioner/physician.  Was given antibiotics and steroids.  At this time pulse ox was 89% room air at rest.  After that she monitored her oxygen had been low at home.  She finally saw Dr. Jetty Duhamellinton Young approximately March 2023 and since then has been on continuous oxygen.  She says the oxygen helps her.  She feels overall she is stable.  She had a high-resolution CT chest that showed ILD she is concerned that she has life-threatening IPF and therefore she is here.  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  The burden is minimal but it is definitely craniocaudal gradient bilateral  bibasal subpleural and reticulation.  Although not sure about traction bronchiectasis.  There is definitely no honeycombing. ? ? ?Chart review shows that in 12/18/2021 she got treated by Rikki Spearingammy Parrott in our office for COPD exacerbation and in January 2023 did see nurse practitioner primary for sinobronchitis ? ?Of note she has been reading about pulmonary fibrosis.  She is worried about this diagnosis.  She is also worried about antifibrotic's. ? ?Recent investigations below ? ? ?PFT 12/13/2018- [facility]- Mild obstruction, Diffusion mildly reduced.FVC 2.83/ 103%, FEV1 1.98/ 101%, R 0.70, FEF25-75 1.26/ 56%, TLC 92%, DLCO 63% ?Lab- 01/16/19- Hypersens pneumonia Neg, Respiratory allergy Profile- elevated IgE for dust mite, cockroach, not for molds ? ?CT Chest data - HRCT April 2023 -personally visualized and agree with the findings ? ? ?Narrative & Impression  ?CLINICAL DATA:  Interstitial lung disease, COPD ?  ?EXAM: ?CT CHEST WITHOUT CONTRAST ?  ?TECHNIQUE: ?Multidetector CT imaging of the chest was performed following the ?standard protocol without intravenous contrast. High resolution ?imaging of the lungs, as well as inspiratory and expiratory imaging, ?was performed. ?  ?RADIATION DOSE REDUCTION: This exam was performed according to the ?departmental dose-optimization program which includes automated ?exposure control, adjustment of the mA and/or kV according to ?patient size and/or use of iterative reconstruction technique. ?  ?COMPARISON:  01/24/2021, 12/09/2017, 06/09/2017 ?  ?FINDINGS: ?Cardiovascular: Aortic atherosclerosis. Normal  heart size. Left ?coronary artery calcifications. Enlargement of the main pulmonary ?artery, measuring up to 3.6 cm in caliber. No pericardial effusion. ?  ?Mediastinum/Nodes: No enlarged mediastinal, hilar, or axillary lymph ?nodes. Thyroid gland, trachea, and esophagus demonstrate no ?significant findings. ?  ?Lungs/Pleura: Moderate centrilobular and paraseptal emphysema.  Mild ?pulmonary fibrosis in a pattern with apical to basal gradient, ?featuring irregular peripheral interstitial opacity, septal ?thickening, and small areas of subpleural bronchiolectasis at the ?lung bases, findings not significantly changed compared to immediate ?prior examination dated 01/24/2021, however slightly worsened over a ?longer period of time dating back to 06/09/2017. No pleural effusion ?or pneumothorax. ?  ?Upper Abdomen: No acute abnormality. ?  ?Musculoskeletal: No chest wall abnormality. No suspicious osseous ?lesions identified. ?  ?IMPRESSION: ?1. Mild pulmonary fibrosis in a pattern with apical to basal ?gradient, featuring irregular peripheral interstitial opacity, ?septal thickening, and small areas of subpleural bronchiolectasis at ?the lung bases, findings not significantly changed compared to ?immediate prior examination dated 01/24/2021, however slightly ?worsened over a longer period of time dating back to 06/09/2017. ?Findings are categorized as probable UIP per consensus guidelines: ?Diagnosis of Idiopathic Pulmonary Fibrosis: An Official ?ATS/ERS/JRS/ALAT Clinical Practice Guideline. Am Rosezetta Schlatter Crit Care ?Med Vol 198, Iss 5, [id number], Jul 17 2017. ?2. Emphysema. ?3. Coronary artery disease. ?4. Enlargement of the main pulmonary artery, as can be seen in ?pulmonary hypertension. ?  ?Aortic Atherosclerosis (ICD10-I70.0) and Emphysema (ICD10-J43.9). ?  ?  ?Electronically Signed ?  By: Jearld Lesch M.D. ?  On: 02/14/2022 15:46  ? ? ?No results found. ? ? ? ?PFT ? ?   ? View : No data to display.  ?  ?  ?  ? ? ? ? ? has a past medical history of Allergy, Diabetes (HCC), Emphysema lung (HCC), and Sinus trouble. ? ? reports that she quit smoking about 28 years ago. Her smoking use included cigarettes. She has a 12.00 pack-year smoking history. She has never used smokeless tobacco. ? ?Past Surgical History:  ?Procedure Laterality Date  ? NASAL SINUS SURGERY    ? TUBAL LIGATION    ? ? ?No  Known Allergies ? ?Immunization History  ?Administered Date(s) Administered  ? Fluad Quad(high Dose 65+) 07/18/2019  ? Influenza Split 09/14/2016, 09/09/2017, 10/10/2018  ? Influenza, High Dose Seasonal PF 09/14/2016, 09/09/2017, 10/10/2018, 07/18/2019, 09/23/2019  ? Influenza-Unspecified 08/27/2018, 07/18/2019, 09/23/2019  ? Pneumococcal Conjugate-13 01/26/2016  ? Pneumococcal-Unspecified 01/26/2016  ? ? ?Family History  ?Problem Relation Age of Onset  ? Cancer Maternal Grandmother   ? ? ? ?Current Outpatient Medications:  ?  Albuterol Sulfate (PROAIR RESPICLICK) 108 (90 Base) MCG/ACT AEPB, Takes 1 puff every 4-6 hours as needed, Disp: 3 each, Rfl: 4 ?  apixaban (ELIQUIS) 5 MG TABS tablet, , Disp: , Rfl:  ?  aspirin EC 81 MG tablet, Take 81 mg by mouth daily., Disp: , Rfl:  ?  atorvastatin (LIPITOR) 40 MG tablet, Take by mouth., Disp: , Rfl:  ?  benzonatate (TESSALON) 200 MG capsule, Take 1 capsule (200 mg total) by mouth 3 (three) times daily as needed for cough., Disp: 50 capsule, Rfl: 3 ?  diltiazem (CARDIZEM CD) 240 MG 24 hr capsule, Take 240 mg by mouth daily., Disp: , Rfl:  ?  flecainide (TAMBOCOR) 50 MG tablet, Take 50 mg by mouth 2 (two) times daily., Disp: , Rfl:  ?  fluticasone-salmeterol (ADVAIR HFA) 115-21 MCG/ACT inhaler, Inhale 2 puffs into the lungs 2 (two) times daily., Disp: 1 each, Rfl: 12 ?  HYDROcodone-acetaminophen (NORCO/VICODIN) 5-325 MG tablet, Take 1 tablet by mouth every 8 (eight) hours as needed., Disp: , Rfl:  ?  Loratadine 10 MG CAPS, Take 10 mg by mouth every morning., Disp: , Rfl:  ?  metFORMIN (GLUCOPHAGE) 500 MG tablet, Take 500 mg by mouth 2 (two) times daily with a meal., Disp: , Rfl:  ?  methocarbamol (ROBAXIN) 750 MG tablet, Take 750 mg by mouth 3 (three) times daily., Disp: , Rfl:  ?  predniSONE (DELTASONE) 10 MG tablet, 2 daily or as directed, Disp: 50 tablet, Rfl: 1 ?  sertraline (ZOLOFT) 50 MG tablet, Take 50 mg by mouth daily., Disp: , Rfl:  ?  traMADol (ULTRAM) 50 MG  tablet, Take 50 mg by mouth 3 (three) times daily as needed., Disp: , Rfl:  ?  meloxicam (MOBIC) 15 MG tablet, Take 1 tablet by mouth daily. (Patient not taking: Reported on 03/31/2022), Disp: , Rfl:  ? ? ?   ?Objec

## 2022-04-01 NOTE — Telephone Encounter (Signed)
Results of pt's CT have been sent to Dr. Vernona Rieger with [facility] and have put on there the info from MR about either having right heart cath performed or having an echo performed. ?

## 2022-04-02 LAB — CK TOTAL AND CKMB (NOT AT ARMC)
CK, MB: <0.7 ng/mL (ref 0–5.0)
Total CK: 16 U/L — ABNORMAL LOW (ref 29–143)

## 2022-04-02 LAB — ALDOLASE: Aldolase: 2.8 U/L (ref <=8.1)

## 2022-04-03 LAB — RHEUMATOID FACTOR: Rheumatoid fact SerPl-aCnc: <14 IU/mL (ref <14)

## 2022-04-03 LAB — CYCLIC CITRUL PEPTIDE ANTIBODY, IGG: Cyclic Citrullin Peptide Ab: <16 UNITS

## 2022-04-03 LAB — ANTI-DNA ANTIBODY, DOUBLE-STRANDED: ds DNA Ab: <1 IU/mL

## 2022-04-03 LAB — QUANTIFERON-TB GOLD PLUS
Mitogen-NIL: >10 IU/mL
NIL: 0.11 IU/mL
QuantiFERON-TB Gold Plus: NEGATIVE
TB1-NIL: 0.18 IU/mL
TB2-NIL: 0.13 IU/mL

## 2022-04-03 LAB — ANTI-SCLERODERMA ANTIBODY: Scleroderma (Scl-70) (ENA) Antibody, IgG: <1 AI

## 2022-04-03 LAB — ANA: Anti Nuclear Antibody (ANA): NEGATIVE

## 2022-04-03 LAB — SJOGREN'S SYNDROME ANTIBODS(SSA + SSB)
SSA (Ro) (ENA) Antibody, IgG: <1 AI
SSB (La) (ENA) Antibody, IgG: <1 AI

## 2022-04-06 LAB — HYPERSENSITIVITY PNEUMONITIS
A. Pullulans Abs: NEGATIVE
A.Fumigatus #1 Abs: NEGATIVE
Micropolyspora faeni, IgG: NEGATIVE
Pigeon Serum Abs: NEGATIVE
Thermoact. Saccharii: NEGATIVE
Thermoactinomyces vulgaris, IgG: NEGATIVE

## 2022-04-07 LAB — ALPHA-1 ANTITRYPSIN PHENOTYPE: A-1 Antitrypsin, Ser: 187 mg/dL (ref 83–199)

## 2022-07-14 ENCOUNTER — Ambulatory Visit (INDEPENDENT_AMBULATORY_CARE_PROVIDER_SITE_OTHER): Payer: Medicare Other | Admitting: Internal Medicine

## 2022-07-14 ENCOUNTER — Encounter: Payer: Self-pay | Admitting: Internal Medicine

## 2022-07-14 VITALS — BP 114/64 | HR 94 | Temp 97.7°F | Ht 62.0 in | Wt 123.8 lb

## 2022-07-14 DIAGNOSIS — J439 Emphysema, unspecified: Secondary | ICD-10-CM

## 2022-07-14 DIAGNOSIS — R0602 Shortness of breath: Secondary | ICD-10-CM

## 2022-07-14 DIAGNOSIS — J8410 Pulmonary fibrosis, unspecified: Secondary | ICD-10-CM

## 2022-07-14 DIAGNOSIS — J449 Chronic obstructive pulmonary disease, unspecified: Secondary | ICD-10-CM

## 2022-07-14 DIAGNOSIS — J849 Interstitial pulmonary disease, unspecified: Secondary | ICD-10-CM | POA: Diagnosis not present

## 2022-07-14 DIAGNOSIS — J9611 Chronic respiratory failure with hypoxia: Secondary | ICD-10-CM | POA: Diagnosis not present

## 2022-07-14 DIAGNOSIS — J84112 Idiopathic pulmonary fibrosis: Secondary | ICD-10-CM

## 2022-07-14 LAB — PULMONARY FUNCTION TEST
DL/VA % pred: 43 %
DL/VA: 1.84 ml/min/mmHg/L
DLCO cor % pred: 40 %
DLCO cor: 7.24 ml/min/mmHg
DLCO unc % pred: 40 %
DLCO unc: 7.24 ml/min/mmHg
FEF 25-75 Post: 1.37 L/sec
FEF 25-75 Pre: 1.15 L/sec
FEF2575-%Change-Post: 19 %
FEF2575-%Pred-Post: 83 %
FEF2575-%Pred-Pre: 70 %
FEV1-%Change-Post: 4 %
FEV1-%Pred-Post: 97 %
FEV1-%Pred-Pre: 93 %
FEV1-Post: 1.94 L
FEV1-Pre: 1.85 L
FEV1FVC-%Change-Post: 1 %
FEV1FVC-%Pred-Pre: 93 %
FEV6-%Change-Post: 0 %
FEV6-%Pred-Post: 105 %
FEV6-%Pred-Pre: 104 %
FEV6-Post: 2.66 L
FEV6-Pre: 2.64 L
FEV6FVC-%Change-Post: -1 %
FEV6FVC-%Pred-Post: 103 %
FEV6FVC-%Pred-Pre: 105 %
FVC-%Change-Post: 2 %
FVC-%Pred-Post: 102 %
FVC-%Pred-Pre: 99 %
FVC-Post: 2.71 L
FVC-Pre: 2.64 L
Post FEV1/FVC ratio: 72 %
Post FEV6/FVC ratio: 98 %
Pre FEV1/FVC ratio: 70 %
Pre FEV6/FVC Ratio: 100 %
RV % pred: 93 %
RV: 2.01 L
TLC % pred: 96 %
TLC: 4.56 L

## 2022-07-14 NOTE — Progress Notes (Signed)
OV 03/31/2022 - -evaluation at the ILD center by Dr. Chase Patton.  Referred by Dr. Baird Patton.  Subjective:  Patient ID: Alice Patton, female , DOB: 02-Dec-1950 , age 71 y.o. , MRN: [id number] , ADDRESS: [place] [address] PCP Alice Bras, PA Patient Care Team: Alice Patton, [place] as PCP - General (Physician Assistant)  This Provider for this visit: Treatment Team:  Attending Provider: Brand Males, MD    03/31/2022 -   Chief Complaint  Patient presents with   Follow-up    Pt here to see MR per Dr. Annamaria Boots.  Pt states she does not have any complaints of SOB as she wears O2 24/7.     HPI Alice Patton 71 y.o. -accompanied by her son Alice Patton.  She is former smoker 12 packs.  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  She typically uses oxygen for sleep.  There is also history of lung nodules not otherwise specified.  History is gained by talking to the patient and review of the medical records.  She tells me now that she is using 2 L nasal cannula continuously for 2 months although today 94% room air x 15 min at rest.  She says that sometime in January 2023 she had a sinus infection saw primary care nurse practitioner/physician.  Was given antibiotics and steroids.  At this time pulse ox was 89% room air at rest.  After that she monitored her oxygen had been low at home.  She finally saw Dr. Baird Patton approximately March 2023 and since then has been on continuous oxygen.  She says the oxygen helps her.  She feels overall she is stable.  She had a high-resolution CT chest that showed ILD she is concerned that she has life-threatening IPF and therefore she is here.  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  The burden is minimal but it is definitely craniocaudal gradient bilateral  bibasal subpleural and reticulation.  Although not sure about traction bronchiectasis.  There is definitely no honeycombing.   Chart review shows that in 12/18/2021 she got treated by Alice Patton in our office for COPD exacerbation and in January 2023 did see nurse practitioner primary for sinobronchitis  Of note she has been reading about pulmonary fibrosis.  She is worried about this diagnosis.  She is also worried about antifibrotic's.  Recent investigations below   PFT 12/13/2018- [facility]- Mild obstruction, Diffusion mildly reduced.FVC 2.83/ 103%, FEV1 1.98/ 101%, R 0.70, FEF25-75 1.26/ 56%, TLC 92%, DLCO 63% Lab- 01/16/19- Hypersens pneumonia Neg, Respiratory allergy Profile- elevated IgE for dust mite, cockroach, not for molds  CT Chest data - HRCT April 2023 -personally visualized and agree with the findings   Narrative & Impression  CLINICAL DATA:  Interstitial lung disease, COPD   EXAM: CT CHEST WITHOUT CONTRAST   TECHNIQUE: Multidetector CT imaging of the chest was performed following the standard protocol without intravenous contrast. High resolution imaging of the lungs, as well as inspiratory and expiratory imaging, was performed.   RADIATION DOSE REDUCTION: This exam was performed according to the departmental dose-optimization program which includes automated exposure control, adjustment of the mA and/or kV according to patient size and/or use of iterative reconstruction technique.   COMPARISON:  01/24/2021, 12/09/2017, 06/09/2017   FINDINGS: Cardiovascular: Aortic atherosclerosis.  Normal heart size. Left coronary artery calcifications. Enlargement of the main pulmonary artery, measuring up to 3.6 cm in caliber. No pericardial effusion.   Mediastinum/Nodes: No enlarged mediastinal, hilar, or axillary lymph nodes. Thyroid gland, trachea, and esophagus demonstrate no significant findings.   Lungs/Pleura: Moderate centrilobular and paraseptal emphysema.  Mild pulmonary fibrosis in a pattern with apical to basal gradient, featuring irregular peripheral interstitial opacity, septal thickening, and small areas of subpleural bronchiolectasis at the lung bases, findings not significantly changed compared to immediate prior examination dated 01/24/2021, however slightly worsened over a longer period of time dating back to 06/09/2017. No pleural effusion or pneumothorax.   Upper Abdomen: No acute abnormality.   Musculoskeletal: No chest wall abnormality. No suspicious osseous lesions identified.   IMPRESSION: 1. Mild pulmonary fibrosis in a pwever slightly worsened over a longer period of time dating back to 07/attern with apical to basal gradient, featuring irregular peripheral interstitial opacity, septal thickening, and small areas of subpleural bronchiolectasis at the lung bases, findings not significantly changed compared to immediate prior examination dated 01/24/2021, ho25/2018. Findings are categorized as probable UIP per consensus guidelines: Diagnosis of Idiopathic Pulmonary Fibrosis: An Official ATS/ERS/JRS/ALAT Clinical Practice Guideline. [place], Iss 5, [phone], Jul 17 2017. 2. Emphysema. 3. Coronary artery disease. 4. Enlargement of the main pulmonary artery, as can be seen in pulmonary hypertension.   Aortic Atherosclerosis (ICD10-I70.0) and Emphysema (ICD10-J43.9).     Electronically Signed   By: Delanna Ahmadi M.D.   On: 02/14/2022 15:46        OV 07/14/2022  Subjective:  Patient ID: Alice Patton, female , DOB: 1950/11/21 , age 71 y.o. , MRN: [id number] , ADDRESS: [place] [address] PCP Alice Bras, PA Patient Care Team: Alice Patton, [place] as PCP - General (Physician Assistant)  This Provider for this visit: Treatment Team:  Attending Provider: Brand Males, MD    07/14/2022 -   Chief Complaint  Patient presents with   Follow-up    PFT performed  today. Pt states she has been doing okay since last visit and denies any real complaints.   #Emphysema with combined ILD #ILD work-up in progress  HPI Alice Patton 71 y.o. -returns for follow-up.  ILD work-up being done.  Her serologies negative.  Alpha-1 antitrypsin is normal.  She reports overall stability.  She does states she desaturates but she says she does not feel dyspnea.  She had pulmonary function testing.  When compared to the one from outside hospital in 2020 this a definite decline in DLCO.  Her ILD question is as follows   Animator ILD Questionnaire  Symptoms:  -Insidious onset of shortness of breath for the last 6 years.  Overall stable.  She has been using oxygen for the last 4 months or 2 L continuous although here in desaturation test she did not desaturate for the limited walk test we did. SYMPTOM SCALE - ILD 07/14/2022  Current weight   O2 use 2L N  Shortness of Breath 0 -> 5 scale with 5 being worst (score 6 If unable to do)  At rest 0  Simple tasks - showers, clothes change, eating, shaving 1  Household (dishes, doing bed, laundry) 1  Shopping 1  Walking level at own pace 0  Walking up Stairs 1  Total (30-36) Dyspnea Score 5      Non-dyspnea symptoms (0-> 5 scale) 07/14/2022  How bad is your cough? 0  How  bad is your fatigue 1  How bad is nausea 0  How bad is vomiting?  0  How bad is diarrhea? 3  How bad is anxiety? 2  How bad is depression 2  Any chronic pain - if so where and how bad 0     Past Medical History :  -Has a known diagnosis of COPD since 2017.  Alpha-1 antitrypsin is normal - Has acid reflux for the last few years - Has diabetes - Has thyroid disease not otherwise specified for several months - Previous history of stroke 10 years ago - No history of COVID-vaccine.  No history of COVID disease   ROS:  -Fatigue for the last few years.  Arthralgia for the last few years. - Weight loss 10 pounds for the last  several months.  FAMILY HISTORY of LUNG DISEASE:  0 no family history of lung disease other than an aunt with asthma.  Her dad did have premature graying.  Patient herself has gray hair but she turned gray at age 60.  PERSONAL EXPOSURE HISTORY:  -Former heavy smoker.  Quit in 1990.  No intravenous drug use no marijuana no vaping  HOME  EXPOSURE and HOBBY DETAILS :  -[place].  She lives in a 73 year old home.  She is lived there for 13 years.  There is some mild mildew on and off on the windows that she cleans up with chemical spray.  But no heavy exposure.  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   Miscellaneous comments x        PFT        Latest Ref Rng & Units 12/13/2018 [facility] 07/14/2022   12:49 PM  PFT Results  FVC-Pre L 2.83 2.64  P  FVC-Predicted Pre % 103% 99  P  FVC-Post L  2.71  P  FVC-Predicted Post %  102  P  Pre FEV1/FVC % %  70  P  Post FEV1/FCV % %  72  P  FEV1-Pre L 1.98 1.85  P  FEV1-Predicted Pre %  93  P  FEV1-Post L  1.94  P  DLCO uncorrected ml/min/mmHg 11.1 7.24   P  DLCO UNC% % 63% 40  P  DLCO corrected ml/min/mmHg  7.24  P  DLCO COR %Predicted %  40  P  DLVA Predicted %  43  P  TLC L  4.56  P  TLC % Predicted % 92% 96  P  RV % Predicted %  93  P    P Preliminary result  Latest Reference Range & Units 03/31/22 12:23  Pro B Natriuretic peptide (BNP) 0.0 - 100.0 pg/mL 27.0  Sed Rate 0 - 30 mm/hr 7  Anti Nuclear Antibody (ANA) NEGATIVE  NEGATIVE  Cyclic Citrullin Peptide Ab UNITS <16  ds DNA Ab IU/mL <1  RA Latex Turbid. <14 IU/mL <14  SSA (Ro) (ENA) Antibody, IgG <1.0 NEG AI <1.0 NEG  SSB (La) (ENA) Antibody, IgG <1.0 NEG AI <1.0 NEG  Scleroderma (Scl-70) (ENA) Antibody, IgG <1.0 NEG AI <1.0 NEG  A.Fumigatus #1 Abs Negative  Negative  Micropolyspora faeni, IgG Negative  Negative  Thermoactinomyces vulgaris, IgG Negative  Negative  A. Pullulans Abs Negative  Negative  Thermoact. Saccharii Negative  Negative  P[place]on Serum Abs Negative  Negative    has a past medical history of Allergy, Diabetes ([place]), Emphysema lung ([place]), and Sinus trouble.   reports that she quit smoking about 28 years ago. Her smoking use included cigarettes. She has a 12.00 pack-year smoking history. She has never used smokeless tobacco.  Past Surgical History:  Procedure Laterality Date   NASAL SINUS SURGERY     TUBAL LIGATION      No Known Allergies  Immunization History  Administered Date(s) Administered   Fluad Quad(high Dose 65+) 07/18/2019   Influenza Split 09/14/2016, 09/09/2017, 10/10/2018   Influenza, High Dose Seasonal PF 09/14/2016, 09/09/2017, 10/10/2018, 07/18/2019, 09/23/2019   Influenza-Unspecified 08/27/2018, 07/18/2019, 09/23/2019   Pneumococcal Conjugate-13 01/26/2016   Pneumococcal-Unspecified 01/26/2016    Family History  Problem Relation Age of Onset   Cancer Maternal Grandmother      Current Outpatient Medications:    Albuterol Sulfate (PROAIR RESPICLICK) 734 (90 Base) MCG/ACT AEPB, Takes 1 puff every 4-6 hours as needed,  Disp: 3 each, Rfl: 4   apixaban (ELIQUIS) 5 MG TABS tablet, , Disp: , Rfl:    aspirin EC 81 MG tablet, Take 81 mg by mouth daily., Disp: , Rfl:    atorvastatin (LIPITOR) 40 MG tablet, Take by mouth., Disp: , Rfl:    benzonatate (TESSALON) 200 MG capsule, Take 1 capsule (200 mg total) by mouth 3 (three) times daily as needed for cough., Disp: 50 capsule, Rfl: 3   diltiazem (CARDIZEM CD) 240 MG 24 hr capsule, Take 240 mg by mouth daily., Disp: , Rfl:    flecainide (TAMBOCOR) 50 MG tablet, Take 50 mg by mouth 2 (two) times daily., Disp: , Rfl:    fluticasone-salmeterol (ADVAIR HFA) 115-21 MCG/ACT inhaler, Inhale 2 puffs into the lungs 2 (two) times daily., Disp: 1 each, Rfl: 12   HYDROcodone-acetaminophen (NORCO/VICODIN) 5-325 MG tablet, Take 1 tablet by mouth every 8 (eight) hours as needed., Disp: , Rfl:    Loratadine 10 MG CAPS, Take 10 mg by mouth every morning., Disp: , Rfl:    meloxicam (MOBIC) 15 MG tablet, Take 1 tablet by mouth daily., Disp: , Rfl:    metFORMIN (GLUCOPHAGE) 500 MG tablet, Take 500 mg by mouth 2 (two) times daily with a meal., Disp: , Rfl:    methocarbamol (ROBAXIN) 750 MG tablet, Take 750 mg by mouth 3 (three) times daily., Disp: , Rfl:    predniSONE (DELTASONE) 10 MG tablet, 2 daily or as directed, Disp: 50 tablet, Rfl: 1   sertraline (ZOLOFT) 50 MG tablet, Take 50 mg by mouth daily., Disp: , Rfl:    traMADol (ULTRAM) 50 MG tablet, Take 50 mg by mouth 3 (three) times daily as needed., Disp: , Rfl:       Objective:   Vitals:  07/14/22 1351  BP: 114/64  Pulse: 94  Temp: 97.7 F (36.5 C)  TempSrc: Oral  SpO2: 94%  Weight: 123 lb 12.8 oz (56.2 kg)  Height: _0  (1.575 m)    Estimated body mass index is 22.64 kg/m as calculated from the following:   Height as of this encounter: _1  (1.575 m).   Weight as of this encounter: 123 lb 12.8 oz (56.2 kg).  _2 @  Filed Weights   07/14/22 1351  Weight: 123 lb 12.8 oz (56.2 kg)     Physical  Exam    General: No distress. Looks well Neuro: Alert and Oriented x 3. GCS 15. Speech normal Psych: Pleasant Resp:  Barrel Chest - no.  Wheeze - no, Crackles - yes baes, No overt respiratory distress CVS: Normal heart sounds. Murmurs - no Ext: Stigmata of Connective Tissue Disease - no HEENT: Normal upper airway. PEERL +. No post nasal drip        Assessment:       ICD-10-CM   1. 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 Chronic respiratory failure with hypoxia (HCC)  J96.11     4. COPD mixed type ([place])  J44.9      Diagnosis of IPF given.  Basis of this explained below.  We discussed antifibrotic's.  She stated everything about IPF and antifibrotic's.  She has chronic diarrhea she does not want to do nintedanib.  We discussed pirfenidone but she is hesitant based on side effect profile.  We discussed the possibility of doing 2 pills 3 times a day which is a low-dose protocol.  Efficacy is there but side effect profile is less.  It is less efficacious.  She is willing to try this.  By enlarge the side effects are reversible.  Normal liver enzymes in March 2023.    Plan:     Patient Instructions     ICD-10-CM   1. ILD (interstitial lung disease) ([place])  J84.9     2. COPD mixed type ([place])  J44.9     3. Combined pulmonary fibrosis and emphysema (CPFE) (HCC)  J43.9    J84.10      #You have emphhysema which you know about- alpha 1 normal  Plan  - continue prior inhaler therapy - advair - check blood A1AT phenotype   #You  have IPF a specific variety of Interstitial Lung Disease (ILD)   -- slowly progerssive 2018- >  2020 -> 2023 - diagnosis based on age, smoking hx, associaed emphysema, negative serology, velcro crackles with craniocaudal grandient and prob UIP pattern with progression on CT -diagnosis given 07/14/2022   Plan - start esbriet per protocol  - "low dose"  protocol of max 2 pills three times daily with food, apply sunscreen, take with meals 5-6h apart - refer pulmnary rehab in [place], [place] - o2 for pulse ox > 88% - use portblno - visit www.pulmonaryfibrosis.org for information - emal to [employer] ptipff_3 .com for support group information  #Elarged Pulmonary ARtery on CT chest April 2023 Coronary artery calcifiation on CT April 2023  You might also have associated pulmonary hypertension thought Blood BNP was normal summer 2023 Heart cath postponed due to intercurrent health issue  Plan  - l let your cardiologist know to consider echo/heart cath as appropriate  Followup  - 6  weeks to discuss esbriet uptake    (  Level 05 visit: Estb 40-54 min   visit type: on-site physical face to visit  in total care time and counseling or/and coordination of care by this undersigned MD - Dr Alice Patton. This includes one or more of the following on this same day 07/14/2022: pre-charting, chart review, note writing, documentation discussion of test results, diagnostic or treatment recommendations, prognosis, risks and benefits of management options, instructions, education, compliance or risk-factor reduction. It excludes time spent by the [place] or office staff in the care of the patient. Actual time 3 min)   SIGNATURE    Dr. Brand Patton, M.D., F.C.C.P,  Pulmonary and Critical Care Medicine Staff Physician, [place] Director - Interstitial Lung Disease  Program  Pulmonary [place] [facility] [place], [place], [address]  Pager: [phone], If no answer or between  15:00h - 7:00h: call [phone] Telephone: [phone]  2:31 PM 07/14/2022

## 2022-07-14 NOTE — Progress Notes (Deleted)
Full PFT Performed Today  

## 2022-07-14 NOTE — Addendum Note (Signed)
Addended by: Wyvonne Lenz on: 07/14/2022 02:38 PM   Modules accepted: Orders

## 2022-07-14 NOTE — Progress Notes (Signed)
Full PFT Performed Today  

## 2022-07-14 NOTE — Patient Instructions (Addendum)
ICD-10-CM   1. ILD (interstitial lung disease) (HCC)  J84.9     2. COPD mixed type (HCC)  J44.9     3. Combined pulmonary fibrosis and emphysema (CPFE) (HCC)  J43.9    J84.10      #You have emphhysema which you know about- alpha 1 normal  Plan  - continue prior inhaler therapy - advair - check blood A1AT phenotype   #You  have IPF a specific variety of Interstitial Lung Disease (ILD)   -- slowly progerssive 2018- >  2020 -> 2023 - diagnosis based on age, smoking hx, associaed emphysema, negative serology, velcro crackles with craniocaudal grandient and prob UIP pattern with progression on CT -diagnosis given 07/14/2022   Plan - start esbriet per protocol  - "low dose" protocol of max 2 pills three times daily with food, apply sunscreen, take with meals 5-6h apart - refer pulmnary rehab in [place], [place] - o2 for pulse ox > 88% - use portblno - visit www.pulmonaryfibrosis.org for information - emal to [employer] ptipff@gmail .com for support group information  #Elarged Pulmonary ARtery on CT chest April 2023 Coronary artery calcifiation on CT April 2023  You might also have associated pulmonary hypertension thought Blood BNP was normal summer 2023 Heart cath postponed due to intercurrent health issue  Plan  - l let your cardiologist know to consider echo/heart cath as appropriate  Followup  - 6  weeks to discuss esbriet uptake

## 2022-07-14 NOTE — Patient Instructions (Signed)
Full PFT Performed Today  

## 2022-07-22 LAB — ALPHA-1 ANTITRYPSIN PHENOTYPE: A-1 Antitrypsin, Ser: 130 mg/dL (ref 83–199)

## 2022-07-27 ENCOUNTER — Telehealth: Payer: Self-pay

## 2022-07-27 ENCOUNTER — Other Ambulatory Visit (HOSPITAL_COMMUNITY): Payer: Self-pay

## 2022-07-27 ENCOUNTER — Telehealth: Payer: Self-pay | Admitting: Pharmacist

## 2022-07-27 DIAGNOSIS — Z5181 Encounter for therapeutic drug level monitoring: Secondary | ICD-10-CM

## 2022-07-27 DIAGNOSIS — J84112 Idiopathic pulmonary fibrosis: Secondary | ICD-10-CM

## 2022-07-27 MED ORDER — PIRFENIDONE 267 MG PO TABS
534.0000 mg | ORAL_TABLET | Freq: Three times a day (TID) | ORAL | 4 refills | Status: AC
  Filled 2022-07-27: qty 180, 30d supply, fill #0

## 2022-07-27 MED ORDER — PIRFENIDONE 267 MG PO TABS
ORAL_TABLET | ORAL | 0 refills | Status: DC
  Filled 2022-07-27: qty 159, fill #0
  Filled 2022-07-29 (×2): qty 159, 30d supply, fill #0

## 2022-07-27 NOTE — Telephone Encounter (Signed)
Rx for pirfenidone titration and maintenance dose sent to [facility]. Counseling documented in separate telephone encounter.  Routing to [place] [facility] for onboarding needs  Chesley Mires, PharmD, MPH, BCPS, CPP Clinical Pharmacist (Rheumatology and Pulmonology)

## 2022-07-27 NOTE — Telephone Encounter (Signed)
Received New start paperwork for ESBRIET. Will update as we work through the benefits process.  Submitted a Prior Authorization request to [employer] for PIRFENIDONE via CoverMyMeds. Authorization has been APPROVED from 06/27/2022 to 07/27/2023.    Test Claim revealed that a 30 day supply has a copay of $14.00 for the maintenance dose.   CMM KEY: [employer] [facility] Authorization#: [id number]  Routing to [facility] for clinical f/u

## 2022-07-27 NOTE — Telephone Encounter (Signed)
Subjective:  Patient called today to [facility] to see pharmacy team for pirfenidone new start counseling.   Patient was last seen by Dr. Chase Caller on 07/14/22. Patient has diagnosis of IPF. PMH also significant for emphysema, COPD mixed type, T2DM, GERD, afib (long-term anticoag with Eliquis), diverticulosis.  Patient reports sensitivity to medications and baseline diarrhea. She states she is not excited to start treatment due to GI side effects but is willing to try medication and gauge how she tolerates.  History of elevated LFTs: No History of diarrhea, nausea, vomiting: Yes -   Objective: No Known Allergies  Outpatient Encounter Medications as of 07/27/2022  Medication Sig   Albuterol Sulfate (PROAIR RESPICLICK) 488 (90 Base) MCG/ACT AEPB Takes 1 puff every 4-6 hours as needed   apixaban (ELIQUIS) 5 MG TABS tablet    aspirin EC 81 MG tablet Take 81 mg by mouth daily.   atorvastatin (LIPITOR) 40 MG tablet Take by mouth.   benzonatate (TESSALON) 200 MG capsule Take 1 capsule (200 mg total) by mouth 3 (three) times daily as needed for cough.   diltiazem (CARDIZEM CD) 240 MG 24 hr capsule Take 240 mg by mouth daily.   flecainide (TAMBOCOR) 50 MG tablet Take 50 mg by mouth 2 (two) times daily.   fluticasone-salmeterol (ADVAIR HFA) 115-21 MCG/ACT inhaler Inhale 2 puffs into the lungs 2 (two) times daily.   HYDROcodone-acetaminophen (NORCO/VICODIN) 5-325 MG tablet Take 1 tablet by mouth every 8 (eight) hours as needed.   Loratadine 10 MG CAPS Take 10 mg by mouth every morning.   meloxicam (MOBIC) 15 MG tablet Take 1 tablet by mouth daily.   metFORMIN (GLUCOPHAGE) 500 MG tablet Take 500 mg by mouth 2 (two) times daily with a meal.   methocarbamol (ROBAXIN) 750 MG tablet Take 750 mg by mouth 3 (three) times daily.   predniSONE (DELTASONE) 10 MG tablet 2 daily or as directed   sertraline (ZOLOFT) 50 MG tablet Take 50 mg by mouth daily.   traMADol (ULTRAM) 50 MG tablet Take 50 mg by  mouth 3 (three) times daily as needed.   No facility-administered encounter medications on file as of 07/27/2022.     Immunization History  Administered Date(s) Administered   Fluad Quad(high Dose 65+) 07/18/2019   Influenza Split 09/14/2016, 09/09/2017, 10/10/2018   Influenza, High Dose Seasonal PF 09/14/2016, 09/09/2017, 10/10/2018, 07/18/2019, 09/23/2019   Influenza-Unspecified 08/27/2018, 07/18/2019, 09/23/2019   Pneumococcal Conjugate-13 01/26/2016   Pneumococcal-Unspecified 01/26/2016      PFT's TLC  Date Value Ref Range Status  07/14/2022 4.56 L Final      CMP     Component Value Date/Time   NA 137 01/19/2022 1507   K 3.8 01/19/2022 1507   CL 99 01/19/2022 1507   CO2 26 01/19/2022 1507   GLUCOSE 216 (H) 01/19/2022 1507   BUN 16 01/19/2022 1507   CREATININE 0.67 01/19/2022 1507   CALCIUM 9.2 01/19/2022 1507   PROT 7.4 01/19/2022 1507   ALBUMIN 4.2 01/19/2022 1507   AST 16 01/19/2022 1507   ALT 15 01/19/2022 1507   ALKPHOS 60 01/19/2022 1507   BILITOT 0.7 01/19/2022 1507      CBC    Component Value Date/Time   WBC 9.5 01/19/2022 1507   RBC 4.52 01/19/2022 1507   HGB 12.3 01/19/2022 1507   HCT 37.2 01/19/2022 1507   PLT 308.0 01/19/2022 1507   MCV 82.3 01/19/2022 1507   MCHC 33.1 01/19/2022 1507   RDW 14.6 01/19/2022 1507  LYMPHSABS 1.0 01/19/2022 1507   MONOABS 0.3 01/19/2022 1507   EOSABS 0.0 01/19/2022 1507   BASOSABS 0.0 01/19/2022 1507      LFT's    Latest Ref Rng & Units 01/19/2022    3:07 PM  Hepatic Function  Total Protein 6.0 - 8.3 g/dL 7.4   Albumin 3.5 - 5.2 g/dL 4.2   AST 0 - 37 U/L 16   ALT 0 - 35 U/L 15   Alk Phosphatase 39 - 117 U/L 60   Total Bilirubin 0.2 - 1.2 mg/dL 0.7     HRCT (02/13/22) - Mild pulmonary fibrosis in a pattern with apical to basal gradient, featuring irregular peripheral interstitial opacity, septal thickening, and small areas of subpleural bronchiolectasis at the lung bases, findings not significantly  changed compared to immediate prior examination dated 01/24/2021, however slightly worsened over a longer period of time dating back to 06/09/2017. Findings are categorized as probable UIP per consensus guidelines  Assessment and Plan  Esbriet Medication Management Thoroughly counseled patient on the efficacy, mechanism of action, dosing, administration, adverse effects, and monitoring parameters of Esbriet.  Patient verbalized understanding.  Goals of Therapy: Will not stop or reverse the progression of IPF. It will slow the progression of ILD.   Dosing: Starting dose will be Esbriet 267 mg 1 tablet three times daily for 7 days, then 2 tablets three times daily as maintenance dose due to her propensity for GI side effects. Stressed the importance of taking with meals and space at least 5-6 hours apart to minimize stomach upset.   Adverse Effects: Nausea, vomiting, diarrhea, weight loss - reviewed importance of taking with meals Abdominal pain GERD Sun sensitivity/rash - patient advised to wear sunscreen when exposed to sunlight Dizziness Fatigue If significant side effects with pirfenidone, can consider Ofev for IPF. However Ofev carries higher risk for diarrhea than pirfenidone which I discussed with the patient.  Monitoring: Monitor for diarrhea, nausea and vomiting, GI perforation, hepatotoxicity  Monitor LFTs - baseline, monthly for first 6 months, then every 3 months routinely LFTs on 03/26/22 wnl CBC w differential at baseline and every 3 months routinely  Access: Approval of Esbriet through: insurance Sports administrator) Rx sent to: [place]: [phone]  Message sent to [facility] for onboarding needs to set up first shipment to home  Medication Reconciliation A drug regimen assessment was performed, including review of allergies, interactions, disease-state management, dosing and immunization history. Medications were reviewed with the patient,  including name, instructions, indication, goals of therapy, potential side effects, importance of adherence, and safe use.  No intxns noted between pirfenidone and current med list.  Thank you for involving pharmacy to assist in providing this patient's care.    Knox Saliva, PharmD, MPH, BCPS, CPP Clinical Pharmacist (Rheumatology and Pulmonology)

## 2022-07-29 ENCOUNTER — Other Ambulatory Visit (HOSPITAL_COMMUNITY): Payer: Self-pay

## 2022-07-29 NOTE — Telephone Encounter (Signed)
Delivery instructions have been updated in [place], medication will be shipped to patient's home address on 07/30/22.  Rx has been processed in [facility] and there is a copay of $14.00. Payment information has been collected and forwarded to the pharmacy.

## 2022-07-30 ENCOUNTER — Other Ambulatory Visit (HOSPITAL_COMMUNITY): Payer: Self-pay

## 2022-08-18 ENCOUNTER — Other Ambulatory Visit (HOSPITAL_COMMUNITY): Payer: Self-pay

## 2022-08-20 ENCOUNTER — Other Ambulatory Visit (HOSPITAL_COMMUNITY): Payer: Self-pay

## 2022-08-27 ENCOUNTER — Ambulatory Visit: Payer: Medicare Other | Admitting: Internal Medicine

## 2022-09-11 ENCOUNTER — Ambulatory Visit: Payer: Medicare Other | Admitting: Internal Medicine

## 2022-09-18 ENCOUNTER — Other Ambulatory Visit (HOSPITAL_COMMUNITY): Payer: Self-pay

## 2022-09-22 ENCOUNTER — Other Ambulatory Visit (HOSPITAL_COMMUNITY): Payer: Self-pay

## 2022-09-23 NOTE — Progress Notes (Signed)
HPI F former smoker followed for COPD.chronic Cough, Lung Nodules, CAD/ Afib, GERD, DM2,  PFT 12/13/2018- [facility]- Mild obstruction, Diffusion mildly reduced.FVC 2.83/ 103%, FEV1 1.98/ 101%, R 0.70, FEF25-75 1.26/ 56%, TLC 92%, DLCO 63% Lab- 01/16/19- Hypersens pneumonia Neg, Respiratory allergy Profile- elevated IgE for dust mite, cockroach, not for molds ------------------------------------------------------------.    02/24/22- 70 yoF former smoker (12 pkyrs) followed for COPD, ILD/UIP, Chronic Hypoxic Respiratory Failure, Lung Nodules, complicated by CAD, AFib cardioverted/ Eliquis, GERD, DM2,  O2 2-4L sleep and activity/ [employer]. Ordered POC -Albuterol hfa, Advair 115-21 HFA, prednisone 10 mgx2 daily since last visit,  Lab- 3/6- BNP 522 Has seen [facility] for chronic serous otitis media eustachian dysfunction, allergic rhinitis> Flonase, took Augmentin and prednisone Covid vax- no Flu vax-no Cannot hear much of anything through right ear and will probably end up needing tympanostomy tube by her ENT. Breathing is stable.  She asks refill Tessalon Perles but has little cough currently after Augmentin.  Remains dependent on oxygen usually at 4 L pulse. We reviewed her CT scan, discussing ILD, potentially UIP.  She accepted my recommendation that we refer her to one of our ILD experts for consultation and management of that problem as appropriate.  She wants to continue to follow with me for general pulmonary. HRCT 02/14/22- IMPRESSION: 1. Mild pulmonary fibrosis in a pattern with apical to basal gradient, featuring irregular peripheral interstitial opacity, septal thickening, and small areas of subpleural bronchiolectasis at the lung bases, findings not significantly changed compared to immediate prior examination dated 01/24/2021, however slightly worsened over a longer period of time dating back to 06/09/2017. Findings are categorized as probable UIP per consensus guidelines: Diagnosis of  Idiopathic Pulmonary Fibrosis: An Official ATS/ERS/JRS/ALAT Clinical Practice Guideline. Am Rosezetta Schlatter Crit Care Med Vol 198, Iss 5, [phone], Jul 17 2017. 2. Emphysema. 3. Coronary artery disease. 4. Enlargement of the main pulmonary artery, as can be seen in pulmonary hypertension. Aortic Atherosclerosis (ICD10-I70.0) and Emphysema (ICD10-J43.9).  09/24/22- 30 yoF former smoker (12 pkyrs) followed for COPD, ILD/UIP, Chronic Hypoxic Respiratory Failure, Lung Nodules, complicated by CAD, AFib cardioverted/ Eliquis, GERD, DM2,  O2 2-4L sleep and activity/ [employer]. POC 1-2L -Albuterol hfa, Advair 115-21 HFA, prednisone 10 mgx2 daily since last visit,  Lab- 3/6- BNP 522 Has seen [facility] for chronic serous otitis media eustachian dysfunction, allergic rhinitis> Flonase, took Augmentin and prednisone Covid vax- no Flu vax-no PFT 07/14/22- minimal obstruction, severe DLCO defect She notes home oxygen 3 shows desaturation occasionally to around 88% but saturation stays up well when she wears her oxygen. She wants to consolidate her oxygen therapy through [employer] who supplied her portable concentrator. She does not want right heart catheterization to explore pulmonary hypertension but agrees to echocardiogram. She does not want to take antifibrotic drugs and she asks that I follow her for her general medical problems including ILD for now.  We discussed the CT results from April, stable over the short-term but with some progression since 2018.  I discussed the significance. She asks refill of her albuterol rescue inhaler to keep it available but does not find she needs it often. HRCT chest- 02/14/22 MPRESSION: 1. Mild pulmonary fibrosis in a pattern with apical to basal gradient, featuring irregular peripheral interstitial opacity, septal thickening, and small areas of subpleural bronchiolectasis at the lung bases, findings not significantly changed compared to immediate prior examination dated  01/24/2021, however slightly worsened over a longer period of time dating back to 06/09/2017. Findings are categorized  as probable UIP per consensus guidelines: Diagnosis of Idiopathic Pulmonary Fibrosis: An Official ATS/ERS/JRS/ALAT Clinical Practice Guideline. Am Rosezetta Schlatter Crit Care Med Vol 198, Iss 5, [phone], Jul 17 2017. 2. Emphysema. 3. Coronary artery disease. 4. Enlargement of the main pulmonary artery, as can be seen in pulmonary hypertension. Aortic Atherosclerosis (ICD10-I70.0) and Emphysema (ICD10-J43.9).   ROS-see HPI   + = positive Constitutional:    weight loss, night sweats, fevers, chills, fatigue, lassitude. HEENT:    headaches, +difficulty swallowing, tooth/dental problems, sore throat,       sneezing, itching, ear ache, nasal congestion, post nasal drip, snoring CV:    chest pain, orthopnea, PND, swelling in lower extremities, anasarca,                                   dizziness, palpitations Resp:   +shortness of breath with exertion or at rest.                productive cough,   non-productive cough, coughing up of blood.              change in color of mucus.  wheezing.   Skin:    rash or lesions. GI:  No-   heartburn, indigestion, abdominal pain, nausea, vomiting, diarrhea,                 change in bowel habits, loss of appetite GU: dysuria, change in color of urine, no urgency or frequency.   flank pain. MS:   joint pain, stiffness, decreased range of motion, back pain. Neuro-     nothing unusual Psych:  change in mood or affect.  depression or anxiety.   memory loss.  OBJ- Physical Exam General- Alert, Oriented, Affect-appropriate, Distress- none acute  +POC 2L Skin- rash-none, lesions- none, excoriation- none Lymphadenopathy- none Head- atraumatic            Eyes- Gross vision intact, PERRLA, conjunctivae and secretions clear            Ears- Hearing, canals-normal            Nose- Clear, no-Septal dev, mucus, polyps, erosion, perforation              Throat- Mallampati II , mucosa clear , drainage- none, tonsils- atrophic Neck- flexible , trachea midline, no stridor , thyroid nl, carotid no bruit Chest - symmetrical excursion , unlabored           Heart/CV- RRR , no murmur , no gallop  , no rub, nl s1 s2                           - JVD+1cm , edema- none, stasis changes- none, varices- none           Lung-+ bilateral crackles lower third of back, wheeze- none, cough- none , dullness-none, rub- none           Chest wall-  Abd-  Br/ Gen/ Rectal- Not done, not indicated Extrem- cyanosis- none, clubbing, none, atrophy- none, strength- nl Neuro- grossly intact to observation

## 2022-09-24 ENCOUNTER — Other Ambulatory Visit (HOSPITAL_COMMUNITY): Payer: Self-pay

## 2022-09-24 ENCOUNTER — Encounter: Payer: Self-pay | Admitting: Internal Medicine

## 2022-09-24 ENCOUNTER — Ambulatory Visit (INDEPENDENT_AMBULATORY_CARE_PROVIDER_SITE_OTHER): Payer: Medicare Other | Admitting: Internal Medicine

## 2022-09-24 VITALS — BP 124/62 | HR 75 | Ht 63.0 in | Wt 128.0 lb

## 2022-09-24 DIAGNOSIS — J449 Chronic obstructive pulmonary disease, unspecified: Secondary | ICD-10-CM | POA: Diagnosis not present

## 2022-09-24 DIAGNOSIS — J9611 Chronic respiratory failure with hypoxia: Secondary | ICD-10-CM | POA: Diagnosis not present

## 2022-09-24 DIAGNOSIS — J849 Interstitial pulmonary disease, unspecified: Secondary | ICD-10-CM | POA: Diagnosis not present

## 2022-09-24 NOTE — Patient Instructions (Signed)
Order- DME [facility]- Continue O2 2L. Add POC 2L pulse portable oxygen   dx ILD, chronic respiratory failure with hypoxia. Ok to do walk test for O2 qualifying.  Ok to [employer] pending appointment on 12/19  Let us know if you need inhalers refilled  Go ahead with echocardiogram as scheduled

## 2022-09-25 NOTE — Assessment & Plan Note (Signed)
Refilling albuterol rescue inhaler

## 2022-09-25 NOTE — Assessment & Plan Note (Signed)
Continues to depend on oxygen Plan-consolidate oxygen therapy maintenance concentrator and POC through [facility] at 2 L

## 2022-09-25 NOTE — Assessment & Plan Note (Signed)
She does not want antifibrotic therapy now and asks that I follow her.  We can get her back with one of our pulmonary fibrosis experts if circumstances change. Plan-we can follow HRCT.  Echocardiogram for evidence of pulmonary hypertension.

## 2022-09-28 ENCOUNTER — Other Ambulatory Visit (HOSPITAL_COMMUNITY): Payer: Self-pay

## 2022-10-01 ENCOUNTER — Other Ambulatory Visit: Payer: Self-pay

## 2022-10-01 DIAGNOSIS — J849 Interstitial pulmonary disease, unspecified: Secondary | ICD-10-CM

## 2022-10-01 DIAGNOSIS — J449 Chronic obstructive pulmonary disease, unspecified: Secondary | ICD-10-CM

## 2022-10-15 ENCOUNTER — Other Ambulatory Visit (HOSPITAL_COMMUNITY): Payer: Self-pay

## 2022-10-19 ENCOUNTER — Ambulatory Visit: Payer: Medicare Other | Admitting: Internal Medicine

## 2022-11-03 ENCOUNTER — Ambulatory Visit: Payer: Medicare Other | Admitting: Internal Medicine

## 2022-11-05 ENCOUNTER — Telehealth: Payer: Self-pay | Admitting: Internal Medicine

## 2022-11-05 DIAGNOSIS — J9611 Chronic respiratory failure with hypoxia: Secondary | ICD-10-CM

## 2022-11-05 NOTE — Telephone Encounter (Signed)
Called [facility] and they state that a new oxygen POC order is needed if patient is wanting POC. New order placed. Will have to be signed by Dr Marchelle Gearing since Dr Maple Hudson is out of office till 11/10/2022. Nothing further needed

## 2022-11-05 NOTE — Telephone Encounter (Signed)
Alice Patton; Alice Patton, Alice Patton; Alice Patton, Alice Patton; Alice Patton, Alice Patton This Patient received Oxygen March of 2023. If pt is needing POC/ best fit please create a new order and include this specific verbiage i have below.  Thank you!  "If new O2 start,, Please evaluate and titrate for best fit POC or portable O2 system. RT to evaluate and titrate patient for POC or Homefill with OCD maintain sats >/=90%. if patient qualifies, dispense POC or homefill with OCD 1-5 pulse dose."

## 2023-01-29 IMAGING — DX DG CHEST 2V
2 series · 2 of 2 positions shown · non-contrast
Comparison: CT chest dated December 12, 2018

CLINICAL DATA: COPD exacerbation

EXAM:
CHEST - 2 VIEW

[chest pa]
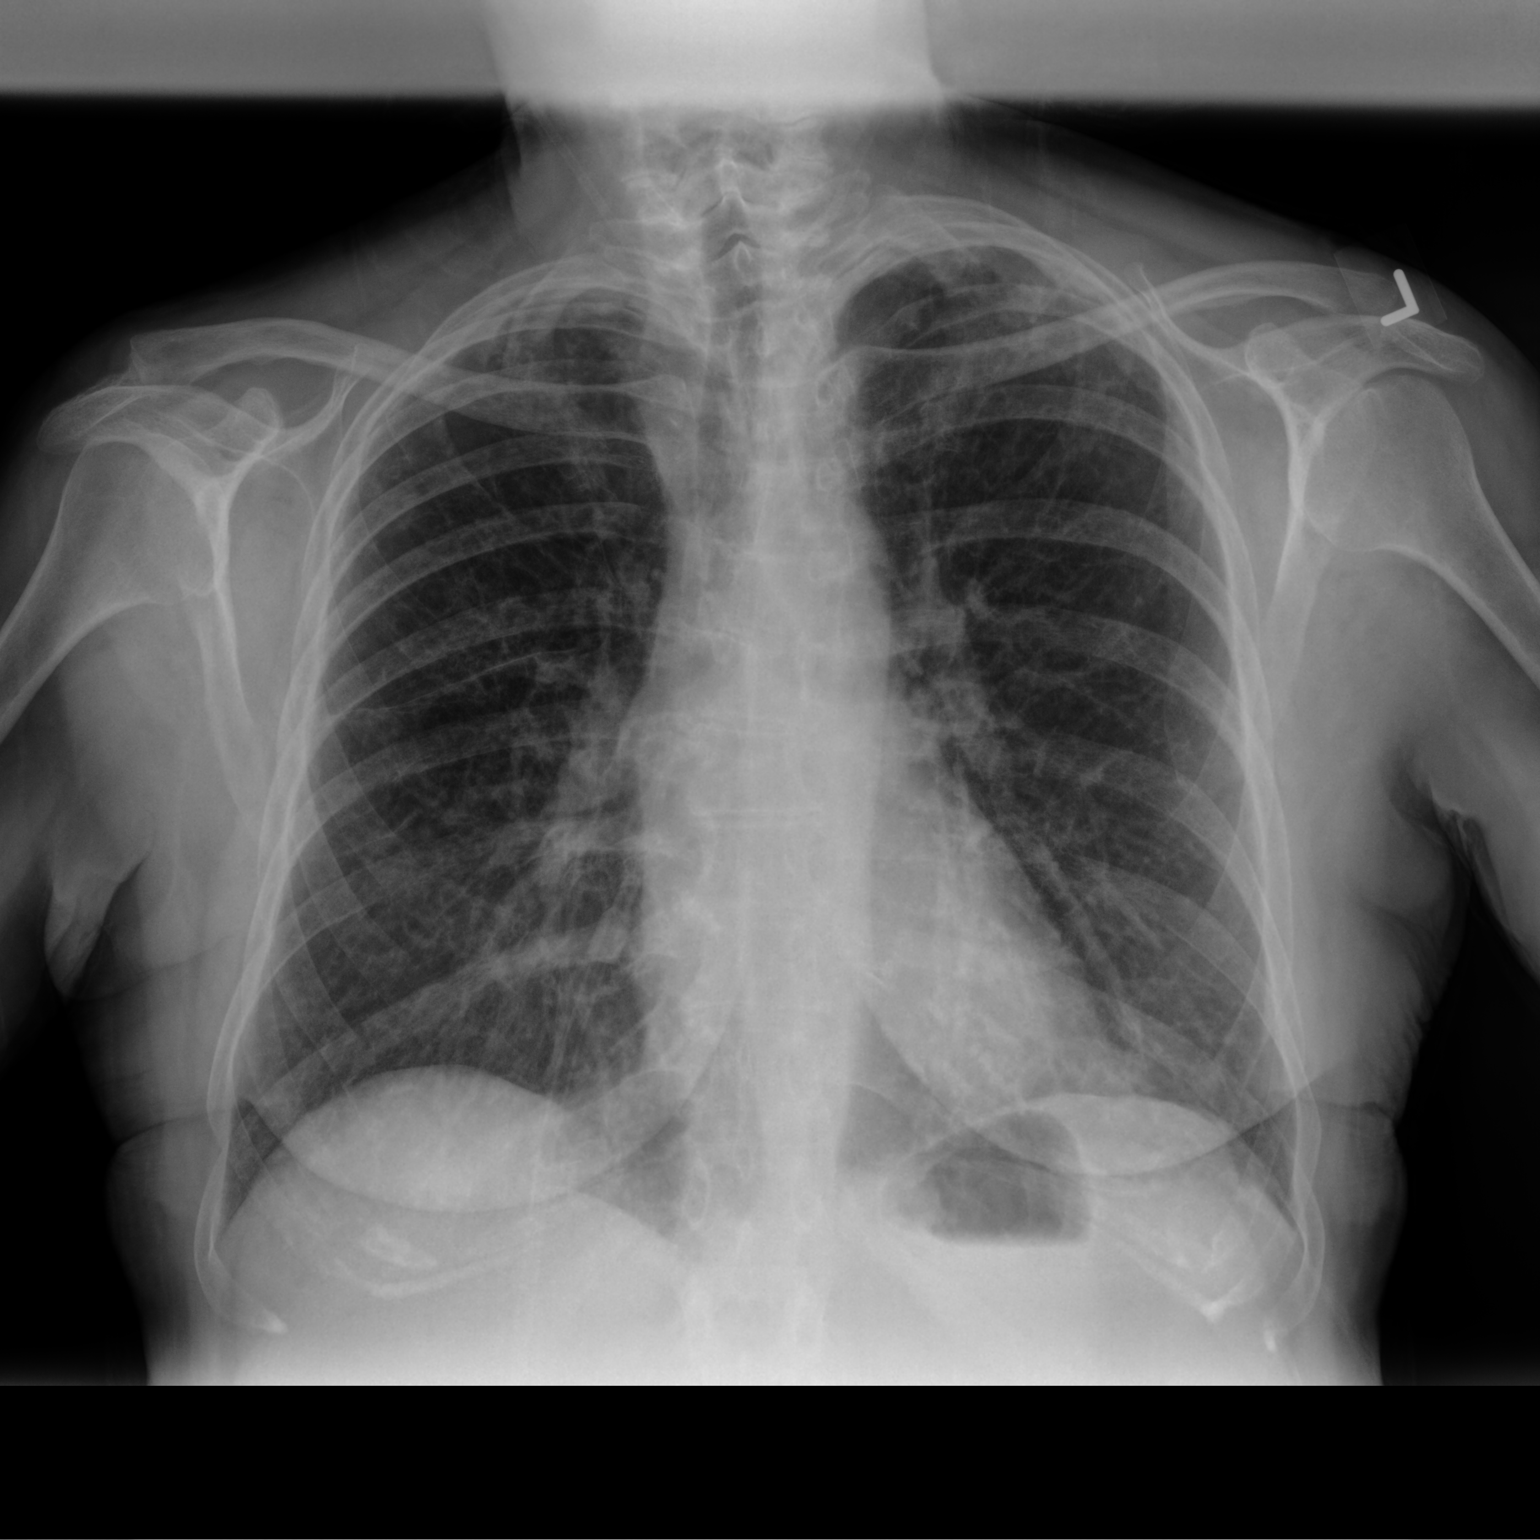

[chest lat]
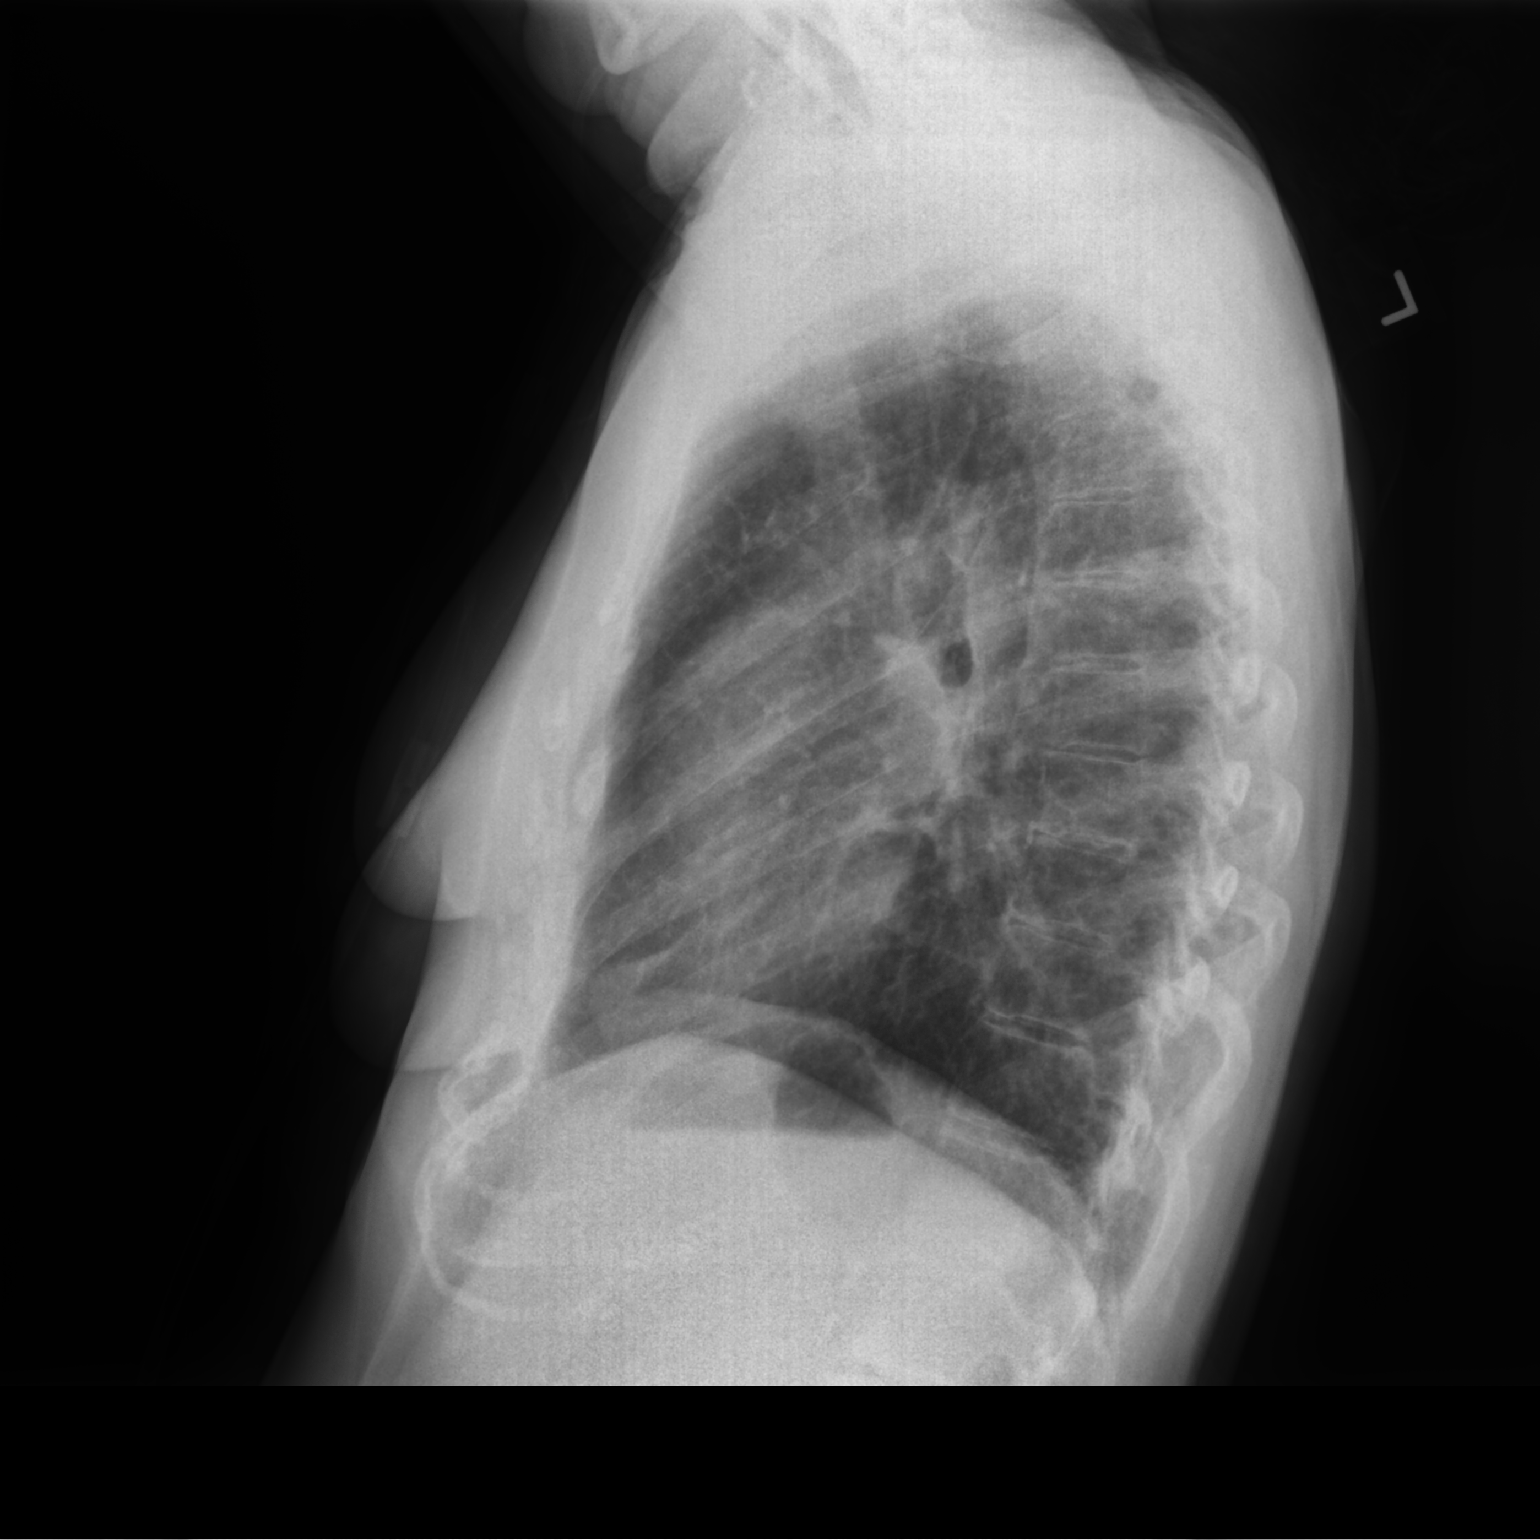

[2 of 2 positions shown; findings below may reference images not displayed]

FINDINGS: The heart size and mediastinal contours are within normal limits.
Advanced emphysematous changes prominent in the upper lobes.
Bilateral lower lobe fibrotic changes. No focal consolidation or
pleural effusion. No appreciable pneumothorax. The visualized
skeletal structures are unremarkable.
IMPRESSION: COPD without evidence of acute cardiopulmonary process.

## 2023-03-23 NOTE — Progress Notes (Signed)
HPI F former smoker followed for COPD.chronic Cough, Lung Nodules, CAD/ Afib, GERD, DM2,  PFT 12/13/2018- [facility]- Mild obstruction, Diffusion mildly reduced.FVC 2.83/ 103%, FEV1 1.98/ 101%, R 0.70, FEF25-75 1.26/ 56%, TLC 92%, DLCO 63% Lab- 01/16/19- Hypersens pneumonia Neg, Respiratory allergy Profile- elevated IgE for dust mite, cockroach, not for molds A1AT Assay 07/14/22- Pi MM 130    WNL PFT 07/14/22- minimal obstruction, severe DLCO defect HRCT 02/14/22- probable UIP ------------------------------------------------------------.  09/24/22- 70 yoF former smoker (12 pkyrs) followed for COPD, ILD/UIP, Chronic Hypoxic Respiratory Failure, Lung Nodules, complicated by CAD, AFib cardioverted/ Eliquis, GERD, DM2,  O2 2L sleep and activity/ [employer]. POC 1-2L -Albuterol hfa, Advair 115-21 HFA, prednisone 10 mgx2 daily since last visit,  Lab- 3/6- BNP 522 Has seen [facility] for chronic serous otitis media eustachian dysfunction, allergic rhinitis> Flonase, took Augmentin and prednisone Covid vax- no Flu vax-no PFT 07/14/22- minimal obstruction, severe DLCO defect She notes home oxygen 3 shows desaturation occasionally to around 88% but saturation stays up well when she wears her oxygen. She wants to consolidate her oxygen therapy through [employer] who supplied her portable concentrator. She does not want right heart catheterization to explore pulmonary hypertension but agrees to echocardiogram. She does not want to take antifibrotic drugs and she asks that I follow her for her general medical problems including ILD for now.  We discussed the CT results from April, stable over the short-term but with some progression since 2018.  I discussed the significance. She asks refill of her albuterol rescue inhaler to keep it available but does not find she needs it often. HRCT chest- 02/14/22 MPRESSION: 1. Mild pulmonary fibrosis in a pattern with apical to basal gradient, featuring irregular peripheral interstitial  opacity, septal thickening, and small areas of subpleural bronchiolectasis at the lung bases, findings not significantly changed compared to immediate prior examination dated 01/24/2021, however slightly worsened over a longer period of time dating back to 06/09/2017. Findings are categorized as probable UIP per consensus guidelines: Diagnosis of Idiopathic Pulmonary Fibrosis: An Official ATS/ERS/JRS/ALAT Clinical Practice Guideline. Am Rosezetta Schlatter Crit Care Med Vol 198, Iss 5, [phone], Jul 17 2017. 2. Emphysema. 3. Coronary artery disease. 4. Enlargement of the main pulmonary artery, as can be seen in pulmonary hypertension. Aortic Atherosclerosis (ICD10-I70.0) and Emphysema (ICD10-J43.9).  03/25/23- 31 yoF former smoker (12 pkyrs) followed for COPD, ILD/UIP, Chronic Hypoxic Respiratory Failure, Lung Nodules, complicated by CAD, AFib cardioverted/ Eliquis, GERD, DM2,  O2 2-4L sleep and activity/ [employer].        POC([employer]) 1-2L -Albuterol HFA, Advair HFA 115-21 -----Pt doing okay. Had a rough time in April with the pollen Admits depression lingering since husband died.  Does not want to go out or be with people. Says she does not usually cough much.  Did have increased nasal drainage and bronchitis in April blamed on pollen, now resolved.  Does not usually cough much. She continues home oxygen now provided by [employer], for the POC.  Says she desaturates if she leaves her oxygen off with any activity around the house. She asks to change her DME company from [facility] to [place] for her home oxygen concentrator. Says echocardiogram was "fine". We are scheduling HRCT.  ROS-see HPI   + = positive Constitutional:    weight loss, night sweats, fevers, chills, fatigue, lassitude. HEENT:    headaches, +difficulty swallowing, tooth/dental problems, sore throat,       sneezing, itching, ear ache, nasal congestion, post nasal drip, snoring CV:  chest pain, orthopnea, PND, swelling in lower extremities,  anasarca,                                   dizziness, palpitations Resp:   +shortness of breath with exertion or at rest.                productive cough,   non-productive cough, coughing up of blood.              change in color of mucus.  wheezing.   Skin:    rash or lesions. GI:  No-   heartburn, indigestion, abdominal pain, nausea, vomiting, diarrhea,                 change in bowel habits, loss of appetite GU: dysuria, change in color of urine, no urgency or frequency.   flank pain. MS:   joint pain, stiffness, decreased range of motion, back pain. Neuro-     nothing unusual Psych:  change in mood or affect.  depression or anxiety.   memory loss.  OBJ- Physical Exam General- Alert, Oriented, Affect-appropriate, Distress- none acute  +POC 2L Skin- rash-none, lesions- none, excoriation- none Lymphadenopathy- none Head- atraumatic            Eyes- Gross vision intact, PERRLA, conjunctivae and secretions clear            Ears- Hearing, canals-normal            Nose- Clear, no-Septal dev, mucus, polyps, erosion, perforation             Throat- Mallampati II , mucosa clear , drainage- none, tonsils- atrophic Neck- flexible , trachea midline, no stridor , thyroid nl, carotid no bruit Chest - symmetrical excursion , unlabored           Heart/CV- RRR , no murmur , no gallop  , no rub, nl s1 s2                           - JVD+1cm , edema- none, stasis changes- none, varices- none           Lung-+ bilateral crackles lower third of back, wheeze- none, cough- none , dullness-none, rub- none           Chest wall-  Abd-  Br/ Gen/ Rectal- Not done, not indicated Extrem- cyanosis- none, clubbing, none, atrophy- none, strength- nl Neuro- grossly intact to observation

## 2023-03-25 ENCOUNTER — Encounter: Payer: Self-pay | Admitting: Internal Medicine

## 2023-03-25 ENCOUNTER — Ambulatory Visit (INDEPENDENT_AMBULATORY_CARE_PROVIDER_SITE_OTHER): Payer: Medicare Other | Admitting: Internal Medicine

## 2023-03-25 VITALS — BP 128/60 | HR 61 | Ht 63.0 in | Wt 131.8 lb

## 2023-03-25 DIAGNOSIS — J9611 Chronic respiratory failure with hypoxia: Secondary | ICD-10-CM

## 2023-03-25 DIAGNOSIS — J449 Chronic obstructive pulmonary disease, unspecified: Secondary | ICD-10-CM

## 2023-03-25 DIAGNOSIS — J849 Interstitial pulmonary disease, unspecified: Secondary | ICD-10-CM

## 2023-03-25 NOTE — Patient Instructions (Addendum)
Order- schedule HRCT chest    [facility]  [facility]  (lives in [place]) Patient request       To continue O2 2l 24/ 7 including [employer] POC for portable

## 2023-03-26 ENCOUNTER — Telehealth: Payer: Self-pay | Admitting: Internal Medicine

## 2023-03-26 NOTE — Telephone Encounter (Signed)
Message from patricia at [employer]: Looks like pt has a Product manager with home fill since 12/19/2021. We will need a order to state "If new O2 start,, Please evaluate and titrate for best fit POC or portable O2 system. RT to evaluate and titrate patient for POC or Homefill with OCD maintain sats >/=90%. if patient qualifies, dispense POC or homefill with OCD 1-5 pulse dose." for [place] to do a Eval for pt to receive a POC.

## 2023-03-27 IMAGING — CT CT MAXILLOFACIAL W/O CM
3 of 5 series · 15 of 47 positions shown, 18 images · non-contrast
Comparison: None.

CLINICAL DATA: Acute pansinusitis.  Recent antibiotic therapy.



[Series 4: sinus 2.00 hr60 s3 cor bone · coronal · 0.26mm/px · 3 of 82 slices shown]
[im 28/82  bone]
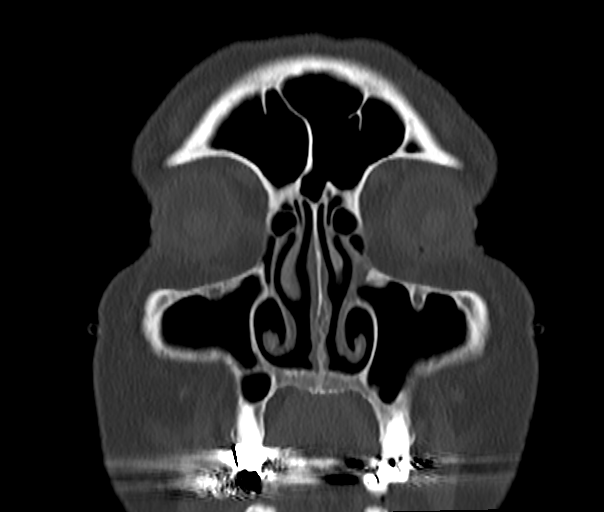
[im 37/82  bone]
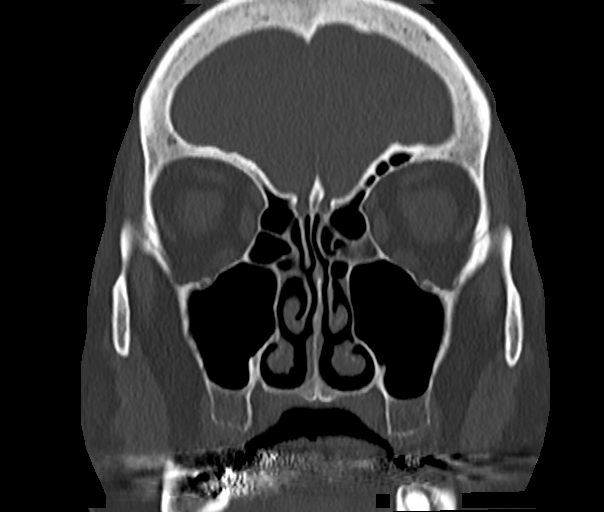
[im 46/82  bone]
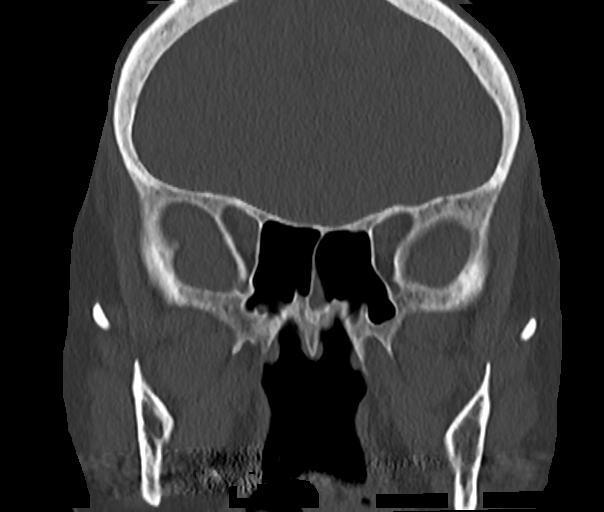

[Series 6: sinus 2.00 hr60 s3 sag bone · sagittal · 0.26mm/px · 3 of 79 slices shown]
[im 27/79  bone]
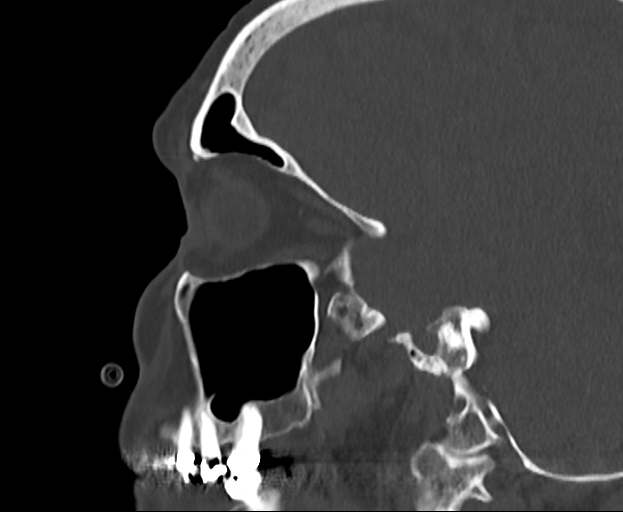
[im 40/79  bone]
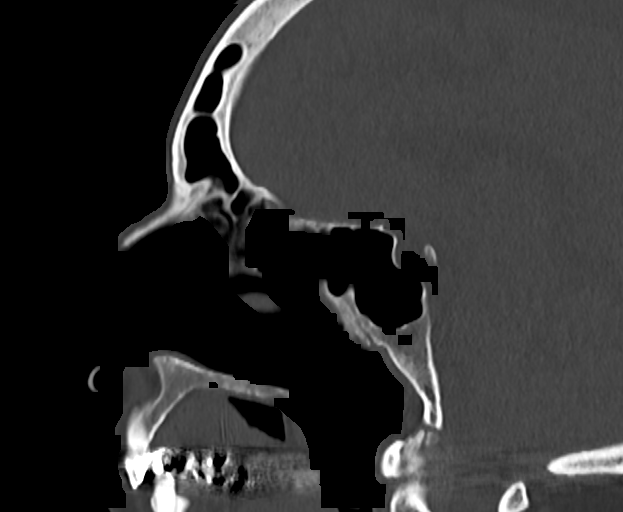
[im 53/79  bone]
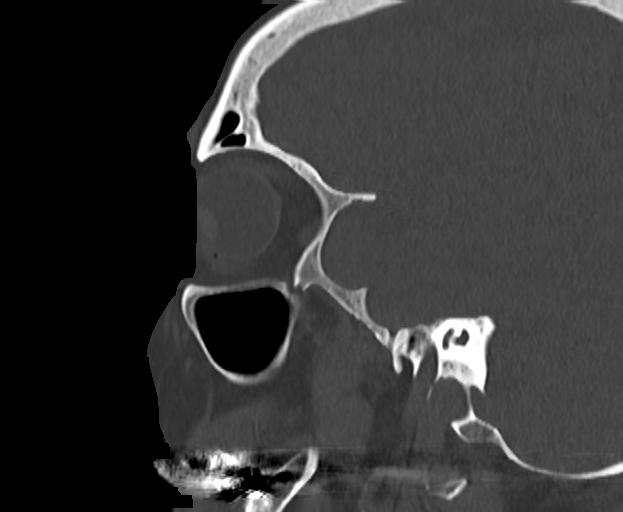

[Series 10: sinus 0.60 hr60 s3 axial fusion thins · axial · 0.31mm/px · z∈[-647,-528]mm · 9 of 225 slices shown, 12 images]
[im 13/225  brain]
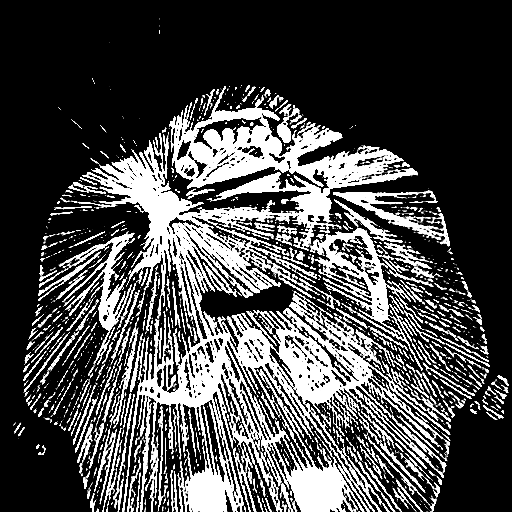
[im 13/225  bone]
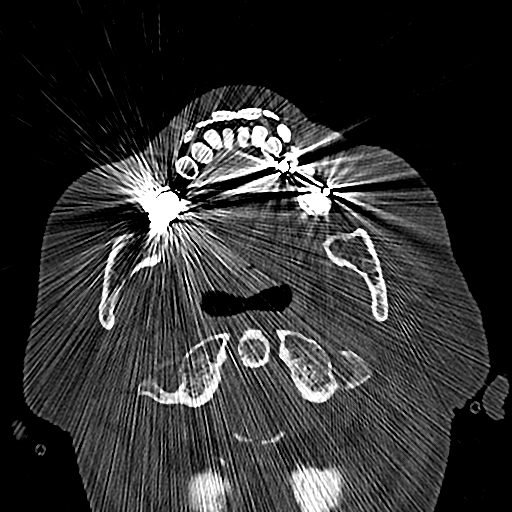
[im 38/225  bone]
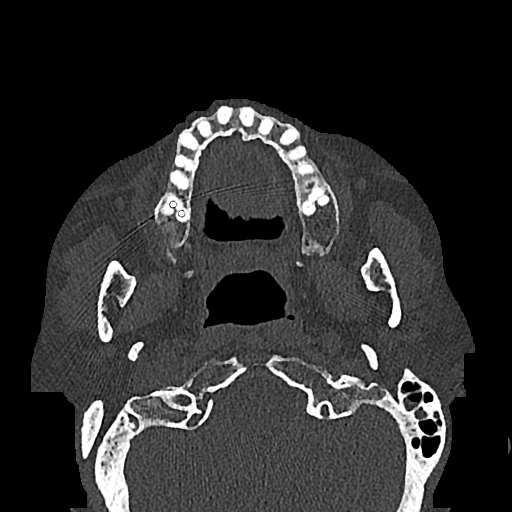
[im 63/225  bone]
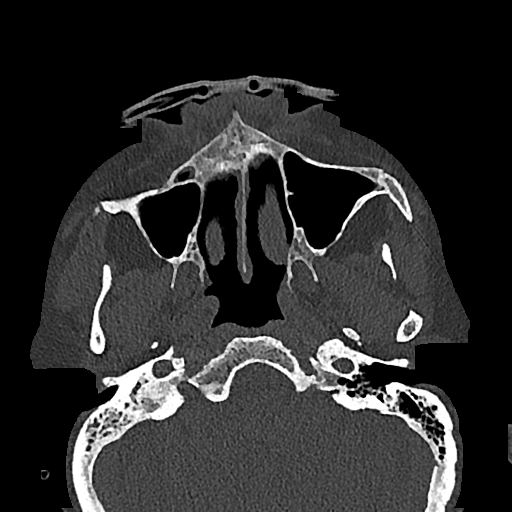
[im 88/225  bone]
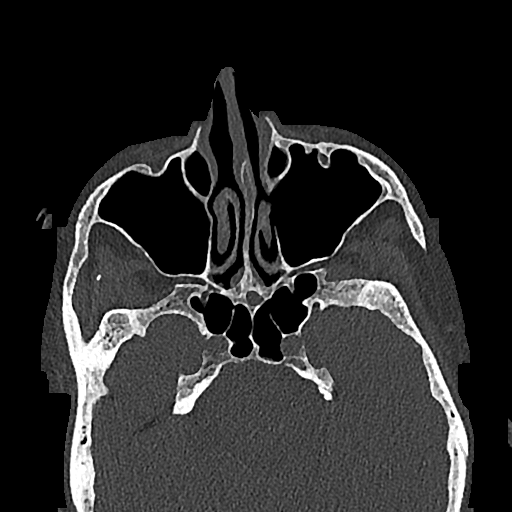
[im 113/225  brain]
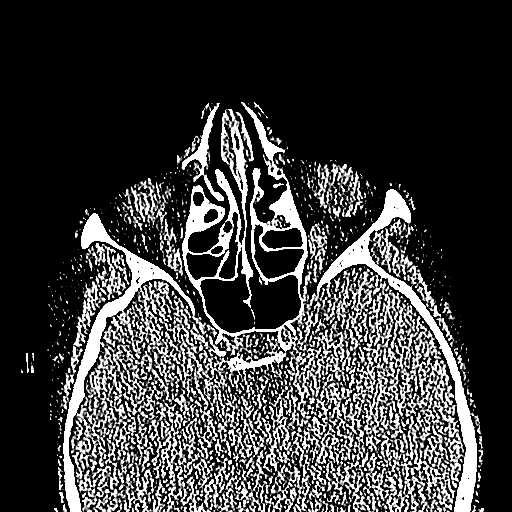
[im 113/225  bone]
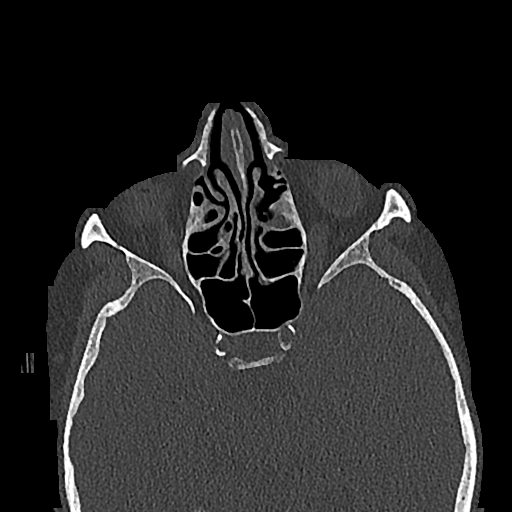
[im 137/225  bone]
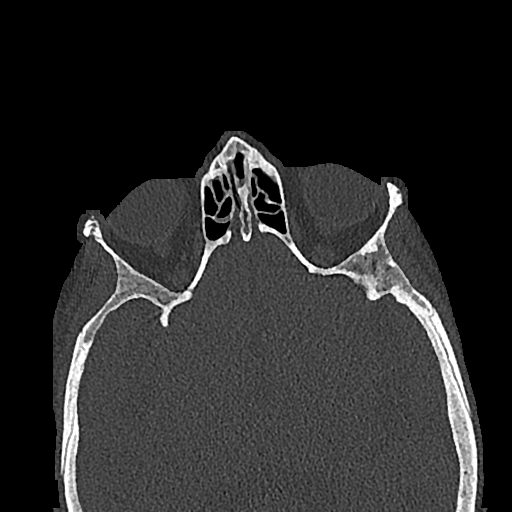
[im 162/225  bone]
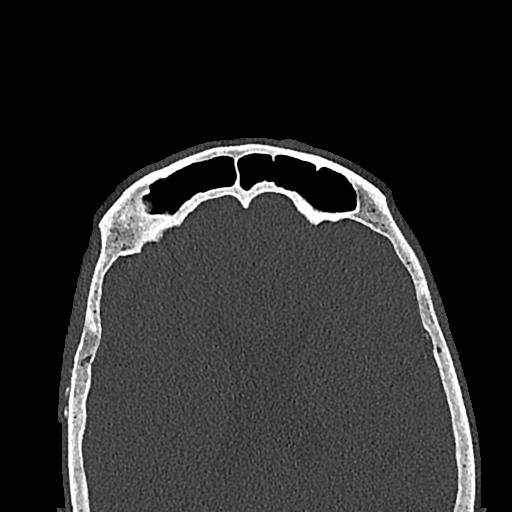
[im 187/225  bone]
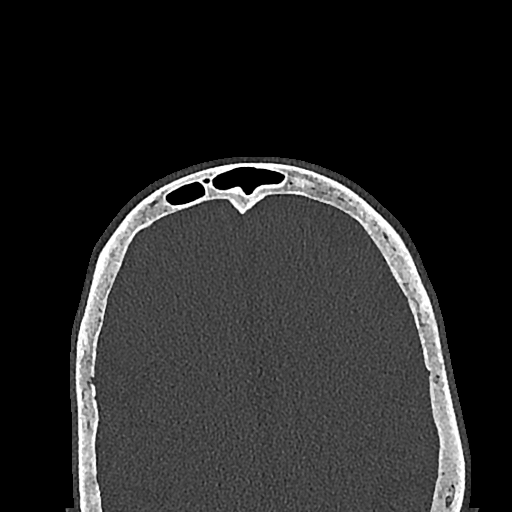
[im 212/225  brain]
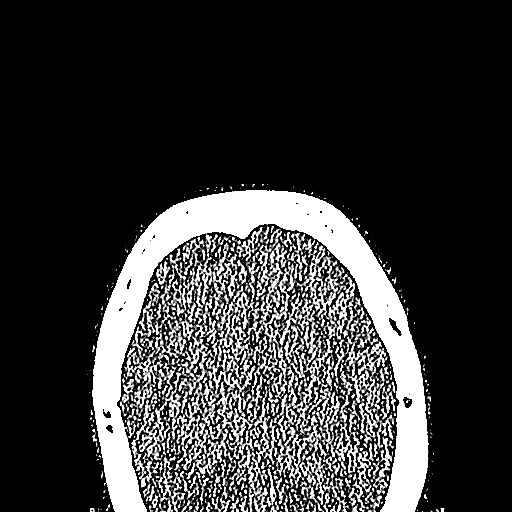
[im 212/225  bone]
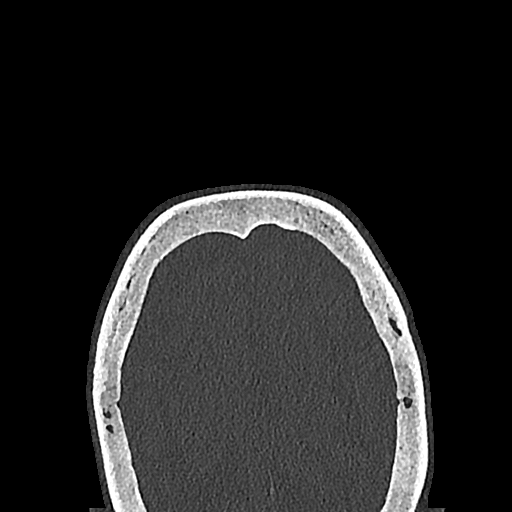

[15 of 47 positions shown; findings below may reference images not displayed]

FINDINGS: Osseous: No fracture or mandibular dislocation. No destructive
process.

Orbits: Negative. No traumatic or inflammatory finding.

Sinuses: Trace mucosal thickening in the left frontal and left
sphenoid sinuses. The other paranasal sinuses are clear.
Near-complete opacification of the right mastoid air cells. Soft
tissue density is noted within the middle ear cavity. Partial
opacification of a few inferior left mastoid air cells is noted.

Soft tissues: Negative.

Limited intracranial: No significant or unexpected finding.
IMPRESSION: 1. Trace mucosal thickening in the left frontal and left sphenoid
sinuses. The other paranasal sinuses are clear.
2. Right middle ear effusion with near-complete opacification of the
right mastoid air cells. Recommend clinical correlation for otitis
media.

## 2023-03-30 NOTE — Telephone Encounter (Signed)
Order has been updated with correct verbiage.

## 2023-03-31 ENCOUNTER — Telehealth: Payer: Self-pay | Admitting: Internal Medicine

## 2023-03-31 NOTE — Telephone Encounter (Signed)
Corrected order has been sent.

## 2023-04-01 NOTE — Telephone Encounter (Signed)
I spoke with the pt and notified of response from [place]  She was already aware  Nothing further needed

## 2023-04-14 ENCOUNTER — Encounter: Payer: Self-pay | Admitting: Internal Medicine

## 2023-04-14 NOTE — Assessment & Plan Note (Signed)
She is dependent on oxygen with even minimal activity. Plan-at her request change DME from [facility] to [employer] for her home concentrator 2 L for sleep and as needed.

## 2023-04-14 NOTE — Assessment & Plan Note (Signed)
She feels controlled with Advair and albuterol.  Asks doxycycline to hold.

## 2023-04-14 NOTE — Assessment & Plan Note (Signed)
We are updating HRCT for status.

## 2023-04-29 ENCOUNTER — Ambulatory Visit
Admission: RE | Admit: 2023-04-29 | Discharge: 2023-04-29 | Disposition: A | Discharge status: Home / Self Care | Payer: Medicare Other | Source: Ambulatory Visit | Attending: Internal Medicine | Admitting: Internal Medicine

## 2023-04-29 DIAGNOSIS — J849 Interstitial pulmonary disease, unspecified: Secondary | ICD-10-CM

## 2023-08-04 ENCOUNTER — Telehealth: Payer: Self-pay

## 2023-08-04 NOTE — Telephone Encounter (Signed)
Per Rema Jasmine, current Prior Authorization is expiring.  Submitted a Prior Authorization request to [employer] for PIRFENIDONE via CoverMyMeds. Will update once we receive a response.   Key: [id number]

## 2023-08-05 NOTE — Telephone Encounter (Signed)
Received notification from [facility] regarding a prior authorization for PIRFENIDONE. Authorization has been APPROVED from 07/05/2023 to 08/03/2024.  Approval letter will be sent to scan center once received.  Updated [employer] with current Prior Authorization information.  Authorization # [id number]

## 2023-12-28 ENCOUNTER — Telehealth: Payer: Self-pay | Admitting: Internal Medicine

## 2023-12-28 NOTE — Telephone Encounter (Signed)
Patient currently using oxygen. Need letter for ok to fly. Patient phone number is [phone].

## 2023-12-30 ENCOUNTER — Encounter: Payer: Self-pay | Admitting: Internal Medicine

## 2023-12-30 NOTE — Telephone Encounter (Signed)
Called and spoke to pt. She is requesting a letter to fly with oxygen.  She would like to pickup the letter once complete.  Dr. Maple Hudson, please advise. Thanks

## 2023-12-30 NOTE — Telephone Encounter (Signed)
Letter is printed and in my bin on C-pod

## 2023-12-30 NOTE — Telephone Encounter (Signed)
Amy, you will be working with Dr. Maple Hudson on 2/14. Will you place the letter up front for pickup. Thanks

## 2023-12-31 NOTE — Telephone Encounter (Signed)
Letter is in envelope at front desk.

## 2024-02-17 ENCOUNTER — Ambulatory Visit: Payer: Self-pay | Admitting: Internal Medicine

## 2024-02-17 NOTE — Telephone Encounter (Signed)
 Copied from CRM [id number]. Topic: Clinical - Red Word Triage >> Feb 17, 2024 10:37 AM Alice Patton wrote: [facility] that prompted transfer to Nurse Triage: oxygen levels lower than normal - she is having a hard time keeping it over 90, and standing it's about 88 - when she moves around it will get down to 70s and as low at 60 - this has been occurring for the past 10 or so days.   TRIAGE SUMMARY NOTE: Pt reporting that she has been having much lower pulse ox readings for the past 8-10 days, had been on 2L oxygen therapy but titrated up on her own to 3L 7-8 days ago and not really improved. Pt reporting she has not been using any of her inhalers or maintenance meds because she "wasn't feeling like I needed to" though she has thought about using them during this time. Pt reporting that she feels "kinda short of breath" but "lot of times don't feel that out of breath but my numbers showing this." Pt confirms her usual pulse ox is 94-95% but during this time her pulse ox has been going down into the 70s% and her legs "get weak" and "wobbly" with walking around, sometimes into high 60s% with activity like chores, otherwise her pulse ox is sitting at 88-90%. Pt reporting she does pursed lip breathing which can get her up to 94% but causes her mild chest pain and her pulse ox falls again when she stops pursed lip breathing. Advised pt be examined asap at hospital, pt refusing at this time. Advised pt take her albuterol inhaler immediately, pt moving around to find it, nurse hearing heavy breathing and wheezing with pt's exertion, pt confirms she feels winded. Advised pt to titrate her oxygen up if she has been told she can do so, pt denies being told she could. Advised strongly pt go to hospital with SOB, weakness, chest pain, and low O2 levels, pt refusing. Advised pt do rescue treatments of albuterol inhaler once found, advised call 911 if worsening or no improvement from rescue treatment. Offered that nurse call back in an  hour to see how doing, pt accepts. Placed in call back. Advised that nurse sending HP message to office for further recommendations with pt's request for appt. Please advise.    E2C2 Pulmonary Triage - Initial Assessment Questions "Chief Complaint (e.g., cough, sob, wheezing, fever, chills, sweat or additional symptoms) *Go to specific symptom protocol after initial questions. First time this has happened Don't feel sick or ill Don't ever feel this SOB, my legs get wobbly when get down into the 70s legs get weak Feel kinda short of breath, lot of times don't feel that out of breath but my numbers showing this Pursed lip breathing get up to 94% but then gradually goes down again, sometimes hangs around 90% or 88%, no the normal for me No dizziness When doing pursed lip breathing heart rate goes up and makes my chest hurt a little bit, as soon as done pursed lip then pain goes away but having to do pursed lip so often, not bad, gets sore/uncomfortable Get so weak feeling Heavy breathing while moving to get inhaler, winded Can't find inhaler Don't want to go to hospital Wheezing with moving heard over phone  "How long have symptoms been present?" Past 8-10 days  "Have you used your inhalers/maintenance medication?" No If yes, "What medications?" Have the meds, not using inhalers wasn't feeling like I needed to, thought about it during these days,  they make me so shaky and nervous I hate taking it   OXYGEN: "Do you wear supplemental oxygen?" Yes If yes, "How many liters are you supposed to use?" Was on 2L but moved up to 3L in the last 7-8 days, hasn't helped   "Do you monitor your oxygen levels?" Yes If yes, "What is your reading (oxygen level) today?" When sitting doing nothing 88% If get up and go to bathroom and come back falls into 70s like 76-78% Had a couple days when try to do something around the house fell into 68%   "What is your usual oxygen saturation reading?"  (Note:  Pulmonary O2 sats should be 90% or greater) 94-95%   Reason for Disposition  Difficulty breathing  Answer Assessment - Initial Assessment Questions 7. LUNG HISTORY: "Do you have any history of lung disease?"  (e.g., pulmonary embolus, asthma, emphysema)     significant  Protocols used: Breathing Difficulty-A-AH, Chest Pain-A-AH  Dr.Young can you please advise

## 2024-02-17 NOTE — Telephone Encounter (Signed)
 Copied from CRM [id number]. Topic: Clinical - Red Word Triage >> Feb 17, 2024 10:37 AM Gaetano Hawthorne wrote: [facility] that prompted transfer to Nurse Triage: oxygen levels lower than normal - she is having a hard time keeping it over 90, and standing it's about 88 - when she moves around it will get down to 70s and as low at 60 - this has been occurring for the past 10 or so days.  TRIAGE SUMMARY NOTE: Pt reporting that she has been having much lower pulse ox readings for the past 8-10 days, had been on 2L oxygen therapy but titrated up on her own to 3L 7-8 days ago and not really improved. Pt reporting she has not been using any of her inhalers or maintenance meds because she "wasn't feeling like I needed to" though she has thought about using them during this time. Pt reporting that she feels "kinda short of breath" but "lot of times don't feel that out of breath but my numbers showing this." Pt confirms her usual pulse ox is 94-95% but during this time her pulse ox has been going down into the 70s% and her legs "get weak" and "wobbly" with walking around, sometimes into high 60s% with activity like chores, otherwise her pulse ox is sitting at 88-90%. Pt reporting she does pursed lip breathing which can get her up to 94% but causes her mild chest pain and her pulse ox falls again when she stops pursed lip breathing. Advised pt be examined asap at hospital, pt refusing at this time. Advised pt take her albuterol inhaler immediately, pt moving around to find it, nurse hearing heavy breathing and wheezing with pt's exertion, pt confirms she feels winded. Advised pt to titrate her oxygen up if she has been told she can do so, pt denies being told she could. Advised strongly pt go to hospital with SOB, weakness, chest pain, and low O2 levels, pt refusing. Advised pt do rescue treatments of albuterol inhaler once found, advised call 911 if worsening or no improvement from rescue treatment. Offered that nurse call back in an  hour to see how doing, pt accepts. Placed in call back. Advised that nurse sending HP message to office for further recommendations with pt's request for appt. Please advise.   E2C2 Pulmonary Triage - Initial Assessment Questions "Chief Complaint (e.g., cough, sob, wheezing, fever, chills, sweat or additional symptoms) *Go to specific symptom protocol after initial questions. First time this has happened Don't feel sick or ill Don't ever feel this SOB, my legs get wobbly when get down into the 70s legs get weak Feel kinda short of breath, lot of times don't feel that out of breath but my numbers showing this Pursed lip breathing get up to 94% but then gradually goes down again, sometimes hangs around 90% or 88%, no the normal for me No dizziness When doing pursed lip breathing heart rate goes up and makes my chest hurt a little bit, as soon as done pursed lip then pain goes away but having to do pursed lip so often, not bad, gets sore/uncomfortable Get so weak feeling Heavy breathing while moving to get inhaler, winded Can't find inhaler Don't want to go to hospital Wheezing with moving heard over phone  "How long have symptoms been present?" Past 8-10 days  "Have you used your inhalers/maintenance medication?" No If yes, "What medications?" Have the meds, not using inhalers wasn't feeling like I needed to, thought about it during these days, they make  me so shaky and nervous I hate taking it  OXYGEN: "Do you wear supplemental oxygen?" Yes If yes, "How many liters are you supposed to use?" Was on 2L but moved up to 3L in the last 7-8 days, hasn't helped  "Do you monitor your oxygen levels?" Yes If yes, "What is your reading (oxygen level) today?" When sitting doing nothing 88% If get up and go to bathroom and come back falls into 70s like 76-78% Had a couple days when try to do something around the house fell into 68%  "What is your usual oxygen saturation reading?"  (Note:  Pulmonary O2 sats should be 90% or greater) 94-95%  Reason for Disposition  Difficulty breathing  Answer Assessment - Initial Assessment Questions 7. LUNG HISTORY: "Do you have any history of lung disease?"  (e.g., pulmonary embolus, asthma, emphysema)     significant  Protocols used: Breathing Difficulty-A-AH, Chest Pain-A-AH

## 2024-02-17 NOTE — Telephone Encounter (Addendum)
 Called pt back to see how doing. Pt reporting that she used her albuterol inhaler twice within 30 min and has felt improvement in her SOB at rest, has not tried to get up yet but pulse ox at 94%. Advised pt call back if worsening or go to hospital if big drop in oxygen levels again with weakness and SOB. Pt reporting that she "just want to get an appt with Dr. Maple Hudson, even the first of next week, I can keep this oxygen up, I've been doing it for days, just have to think about it and sit a lot." Pt reporting that she will go to hospital if really an emergency otherwise requesting appt with Dr. Maple Hudson. Please call pt back to schedule as appropriate or provide further recommendations.

## 2024-02-17 NOTE — Telephone Encounter (Signed)
 Ok next available with me- held spot ok

## 2024-02-29 ENCOUNTER — Telehealth: Payer: Self-pay | Admitting: Internal Medicine

## 2024-02-29 DIAGNOSIS — J441 Chronic obstructive pulmonary disease with (acute) exacerbation: Secondary | ICD-10-CM

## 2024-02-29 MED ORDER — FLUTICASONE-SALMETEROL 115-21 MCG/ACT IN AERO: 2.0000 | INHALATION_SPRAY | Freq: Two times a day (BID) | RESPIRATORY_TRACT | 12 refills | Status: AC

## 2024-02-29 MED ORDER — ALBUTEROL SULFATE 108 (90 BASE) MCG/ACT IN AEPB: INHALATION_SPRAY | RESPIRATORY_TRACT | 4 refills | Status: AC

## 2024-02-29 NOTE — Telephone Encounter (Signed)
 Has been desaturating easily for last few weeks including walking hall at cardiology office. Denies recognized infection, leg pain/swelling, other clues. Resistant to going to hosp but agrees to if gets worse. I explained I will be away rest of week and she can call office for help if needed. For now she just asks refills of her inhalers- sent.

## 2024-02-29 NOTE — Telephone Encounter (Signed)
 Copied from CRM [id number]. Topic: Appointments - Appointment Info/Confirmation >> Feb 28, 2024  1:10 PM Alverda Joe S wrote: Patient/patient representative is calling for information regarding an appointment.  Patient is calling to speak with Dr. Linder Revere only in regards to a serious matter, patient states she dont want to speak with any nurses just dr. Linder Revere, please call patient at [phone]  Dr.Young can you please advise .  Thank you

## 2024-03-25 NOTE — Progress Notes (Unsigned)
 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   03/25/23- 71 yoF former smoker (12 pkyrs) followed for COPD, ILD/UIP, Chronic Hypoxic Respiratory Failure, Lung Nodules, complicated by CAD, AFib cardioverted/ Eliquis, GERD, DM2,  O2 2-4L sleep and activity/ [employer].        POC([employer]) 1-2L -Albuterol  HFA, Advair  HFA 115-21 -----Pt doing okay. Had a rough time in April with the pollen Admits depression lingering since husband died.  Does not want to go out or be with people. Says she does not usually cough much.  Did have increased nasal drainage and bronchitis in April blamed on pollen, now resolved.  Does not usually cough much. She continues home oxygen now provided by [employer], for the POC.  Says she desaturates if she leaves her oxygen off with any activity around the house. She asks to change her DME company from [employer] to [employer] for her home oxygen concentrator. Says echocardiogram was "fine". We are scheduling HRCT.  03/27/24- 72 yoF former smoker (12 pkyrs) followed for COPD, ILD/UIP, Chronic Hypoxic Respiratory Failure, Lung Nodules, complicated by CAD, AFib cardioverted/ Eliquis, GERD, DM2,  O2 2-4L sleep and activity/ [employer].        POC([employer]) 1-2L -Albuterol  HFA, Advair  HFA 115-21      Needs updated HRCT -------SaO2 dropping with any exertion to low 70's.   Discussed the use of AI scribe software for clinical note transcription with the patient, who gave verbal consent to proceed.  History of Present  Illness   Lastar Fedeli is a 73 year old female with pulmonary fibrosis who presents with worsening hypoxemia and dyspnea on exertion. Her son drove her here.  Over the past month, she experiences significant drops in oxygen saturation, reaching as low as 68% with exertion such as standing or walking. Her saturation is 97% after lying down overnight. She has increased her oxygen flow from 2 to 3 liters without significant improvement. Despite these episodes, she remains active by walking slowly around the house. She had been quite stable over the last year or two. The current change was mainly noted over a day or two, without obvious associated acute illness. I had her see Dr Bertrum Brodie 2 years ago, but she had asked to stay with me unless there were significant changes in her ILD, and she had not wanted antifibrotics then. I discussed possibility of Hamman-Rich, PE.  She experiences weakness and instability in her legs, affecting her balance. Slight chest pain occurs with exertion, especially when breathing is difficult. No changes in her cough or cold symptoms are present. Inhalers contribute to shakiness and nervousness. Cardiology did not find obvious acute issues.  She ensures her oxygen concentrator is functioning properly and uses a portable oxygen system, including an [employer] device, which is effective. We put her on tank O2 here at 4 l and will ask [employer] to change her to a portable tank for continuous flow 4-5L.     Assessment and Plan:    Pulmonary fibrosis Sudden significant drop in oxygen levels upon exertion, concern for acute exacerbation or accelerated worsening. Differential includes pulmonary embolism, Hamman-Rich exacerbation  of ILD. - Order high-resolution chest CT to assess fibrosis status. - Check blood work, including red blood count, to rule out other causes of hypoxemia. - Refer to Dr. Bertrum Brodie vs hospital for further evaluation based on findings..  Hypoxemia Significant  hypoxemia with exertion, oxygen saturation drops to 72-77% upon standing or moving. Increased oxygen flow from 2 to 3 liters without significant improvement. - Request [employer] to change portable oxygen system to a 5-liter continuous flow compressed gas tank system. - Advise increasing oxygen flow to 4 liters during exertion.  Chronic obstructive pulmonary disease (COPD) COPD may contribute to respiratory symptoms. No change in cough or chest pain related to COPD exacerbation.  Weakness Weakness and wobbliness in legs, possibly related to hypoxemia and decreased mobility. - Encourage gentle walking and mobility exercises within the home. - Consider pulmonary rehabilitation once respiratory status is stabilized.  Follow-up Prefers in-person discussion of test results to avoid distressing news over the phone. - Schedule follow-up appointment in three weeks to discuss test results and further management.     ROS-see HPI   + = positive Constitutional:    weight loss, night sweats, fevers, chills, fatigue, lassitude. HEENT:    headaches, +difficulty swallowing, tooth/dental problems, sore throat,       sneezing, itching, ear ache, nasal congestion, post nasal drip, snoring CV:    chest pain, orthopnea, PND, swelling in lower extremities, anasarca,                                   dizziness, palpitations Resp:   +shortness of breath with exertion or at rest.                productive cough,   non-productive cough, coughing up of blood.              change in color of mucus.  wheezing.   Skin:    rash or lesions. GI:  No-   heartburn, indigestion, abdominal pain, nausea, vomiting, diarrhea,                 change in bowel habits, loss of appetite GU: dysuria, change in color of urine, no urgency or frequency.   flank pain. MS:   joint pain, stiffness, decreased range of motion, back pain. Neuro-     nothing unusual Psych:  change in mood or affect.  depression or anxiety.   memory loss.  OBJ-  Physical Exam General- Alert, Oriented, Affect-appropriate/ anxious/ talkative, Distress- none acute  +POC 2L> our tank 4L (90% sat) Skin- rash-none, lesions- none, excoriation- none Lymphadenopathy- none Head- atraumatic            Eyes- Gross vision intact, PERRLA, conjunctivae and secretions clear            Ears- Hearing, canals-normal            Nose- Clear, no-Septal dev, mucus, polyps, erosion, perforation             Throat- Mallampati II , mucosa clear , drainage- none, tonsils- atrophic Neck- flexible , trachea midline, no stridor , thyroid nl, carotid no bruit Chest - symmetrical excursion , unlabored           Heart/CV- RRR , no murmur , no gallop  , no rub, nl s1 s2                           -  JVD-none , edema- none, stasis changes- none, varices- none           Lung-+ faint bilateral crackles lower third of back, wheeze- none, cough- none , dullness-none, rub- none           Chest wall-  Abd-  Br/ Gen/ Rectal- Not done, not indicated Extrem- cyanosis- none, clubbing-none, atrophy- none, strength- nl, hands warm Neuro- grossly intact to observation

## 2024-03-27 ENCOUNTER — Encounter: Payer: Self-pay | Admitting: Internal Medicine

## 2024-03-27 ENCOUNTER — Ambulatory Visit: Payer: Medicare Other | Admitting: Internal Medicine

## 2024-03-27 VITALS — BP 136/64 | HR 104 | Temp 98.1°F | Ht 63.0 in | Wt 131.4 lb

## 2024-03-27 DIAGNOSIS — J449 Chronic obstructive pulmonary disease, unspecified: Secondary | ICD-10-CM

## 2024-03-27 DIAGNOSIS — J8410 Pulmonary fibrosis, unspecified: Secondary | ICD-10-CM | POA: Diagnosis not present

## 2024-03-27 DIAGNOSIS — R531 Weakness: Secondary | ICD-10-CM | POA: Diagnosis not present

## 2024-03-27 DIAGNOSIS — J849 Interstitial pulmonary disease, unspecified: Secondary | ICD-10-CM

## 2024-03-27 DIAGNOSIS — R0609 Other forms of dyspnea: Secondary | ICD-10-CM

## 2024-03-27 DIAGNOSIS — R0902 Hypoxemia: Secondary | ICD-10-CM

## 2024-03-27 DIAGNOSIS — Z87891 Personal history of nicotine dependence: Secondary | ICD-10-CM

## 2024-03-27 DIAGNOSIS — J9611 Chronic respiratory failure with hypoxia: Secondary | ICD-10-CM

## 2024-03-27 LAB — CBC WITH DIFFERENTIAL/PLATELET
Basophils Absolute: 0.1 10*3/uL (ref 0.0–0.1)
Basophils Relative: 0.8 % (ref 0.0–3.0)
Eosinophils Absolute: 0.1 10*3/uL (ref 0.0–0.7)
Eosinophils Relative: 1.9 % (ref 0.0–5.0)
HCT: 43.1 % (ref 36.0–46.0)
Hemoglobin: 14.4 g/dL (ref 12.0–15.0)
Lymphocytes Relative: 21.9 % (ref 12.0–46.0)
Lymphs Abs: 1.7 10*3/uL (ref 0.7–4.0)
MCHC: 33.3 g/dL (ref 30.0–36.0)
MCV: 85 fl (ref 78.0–100.0)
Monocytes Absolute: 0.4 10*3/uL (ref 0.1–1.0)
Monocytes Relative: 5.8 % (ref 3.0–12.0)
Neutro Abs: 5.3 10*3/uL (ref 1.4–7.7)
Neutrophils Relative %: 69.6 % (ref 43.0–77.0)
Platelets: 180 10*3/uL (ref 150.0–400.0)
RBC: 5.08 Mil/uL (ref 3.87–5.11)
RDW: 14.6 % (ref 11.5–15.5)
WBC: 7.6 10*3/uL (ref 4.0–10.5)

## 2024-03-27 LAB — BASIC METABOLIC PANEL WITH GFR
BUN: 14 mg/dL (ref 6–23)
CO2: 26 meq/L (ref 19–32)
Calcium: 10.3 mg/dL (ref 8.4–10.5)
Chloride: 102 meq/L (ref 96–112)
Creatinine, Ser: 0.92 mg/dL (ref 0.40–1.20)
GFR: 61.24 mL/min (ref >60.00)
Glucose, Bld: 205 mg/dL — ABNORMAL HIGH (ref 70–99)
Potassium: 4.4 meq/L (ref 3.5–5.1)
Sodium: 139 meq/L (ref 135–145)

## 2024-03-27 LAB — D-DIMER, QUANTITATIVE: D-Dimer, Quant: 0.34 ug{FEU}/mL (ref <0.50)

## 2024-03-27 LAB — BRAIN NATRIURETIC PEPTIDE: Pro B Natriuretic peptide (BNP): 361 pg/mL — ABNORMAL HIGH (ref 0.0–100.0)

## 2024-03-27 NOTE — Patient Instructions (Signed)
 Order- DME [employer]- please change portable O2 to 5L continuous   ASAP please  Order- schedule HRCT chest    dx ILD exacerbation  ASAP  Order- lab- CBC w diff, BMET, D-dimer , BNatriuretic Peptide   dx dyspnea on exertion

## 2024-03-28 ENCOUNTER — Telehealth: Payer: Self-pay

## 2024-03-28 ENCOUNTER — Ambulatory Visit: Payer: Self-pay | Admitting: Internal Medicine

## 2024-03-28 NOTE — Telephone Encounter (Signed)
 Per Dr. Linder Revere:  Labs- blood count was ok and D-dimer test for blood clot was negative. Glucose was high at 205. The main finding was elevated BNP at 361. This indicates fluid overload, as in left side heart failure. Suggest she contact her cardiologist with this information.     Called patient.  Gave information.  Patient will call Dr. Zetta Hillier, her cardiologist, today.  He is part of [facility] in [place], [place].  Patient states it is very hard to get in to see Dr. Stephane Ee.  Also, patient states Dr. Linder Revere wanted patient to follow up in 3 weeks with him but Dr. Linder Revere does not have an opening until the end of July.  Patient was placed on a cancellation list for a sooner appointment if available.  Patient states it is still hard for her to breathe.  Patient received her continuous oxygen that she is using at 4L today from [employer].  She does state that the continuous oxygen is helping a great deal with her SOB.  Patient is also scheduled for a high-resolution chest CT on 04/03/2024.  Dr. Linder Revere, this is just FYI.

## 2024-03-28 NOTE — Progress Notes (Signed)
 Labs- blood count was ok and D-dimer test for blood clot was negative. Glucose was high at 205. The main finding was elevated BNP at 361. This indicates fluid overload, as in left side heart failure. Suggest she contact her cardiologist with this information.

## 2024-04-03 ENCOUNTER — Other Ambulatory Visit

## 2024-05-10 ENCOUNTER — Telehealth: Payer: Self-pay

## 2024-05-10 NOTE — Telephone Encounter (Signed)
 I can see Alice Patton CT chest report in computer. [facility] is an [facility]. Is there anyway she could get a copy of the Images on disc from [facility] radiology to bring here with her when she comes?

## 2024-05-10 NOTE — Telephone Encounter (Signed)
 Copied from CRM [id number]. Topic: Clinical - Request for Lab/Test Order >> May 10, 2024 10:22 AM Devaughn RAMAN wrote: Reason for CRM: patient had CT scan and blood work on May 12th, patient inpatient at [facility] May 13th as she fell and broke her ankle. Patient had a CT scan done while she was inpatient at [facility]. Patient would like to know should she bring her CT report from [facility] for her July 8th visit or does she need to complete a new CT Chest x-ray, referral on file for patient. Please follow up and advise. Patient was thankful and verbalized understanding.  Please advise if patient will need new scan there is a cta in care everywhere from 5/13

## 2024-05-11 NOTE — Telephone Encounter (Signed)
 Called patient.  She is calling [facility] to have them make a CD of the CT Chest and chest xray from [facility].  She has an upcoming appointment with Dr. Neysa on 05/23/2024 and will bring the CD with her.

## 2024-05-11 NOTE — Telephone Encounter (Signed)
 PT ret all. Understands. NFN

## 2024-05-12 ENCOUNTER — Telehealth: Payer: Self-pay

## 2024-05-12 NOTE — Telephone Encounter (Signed)
 Copied from CRM [id number]. Topic: Clinical - Request for Lab/Test Order >> May 10, 2024 10:22 AM Devaughn RAMAN wrote: Reason for CRM: patient had CT scan and blood work on May 12th, patient inpatient at [facility] May 13th as she fell and broke her ankle. Patient had a CT scan done while she was inpatient at [facility]. Patient would like to know should she bring her CT report from [facility] for her July 8th visit or does she need to complete a new CT Chest x-ray, referral on file for patient. Please follow up and advise. Patient was thankful and verbalized understanding. >> May 11, 2024  2:14 PM Whitney O wrote: Patient is returning a call she received concerning what she called about yesterday . Didn't see any notes called cal and got patient transferred over to [facility]  Please see other encounter as of 05-11-24. NFN

## 2024-05-20 NOTE — Progress Notes (Unsigned)
 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   03/27/24- 72 yoF former smoker (12 pkyrs) followed for COPD, ILD/UIP, Chronic Hypoxic Respiratory Failure, Lung Nodules, complicated by CAD, AFib cardioverted/ Eliquis, GERD, DM2,  O2 2-4L sleep and activity/ [employer].        POC(Inogen) 1-2L -Albuterol  HFA, Advair  HFA 115-21      Needs updated HRCT -------SaO2 dropping with any exertion to low 70's.   Discussed the use of AI scribe software for clinical note transcription with the patient, who gave verbal consent to proceed.  History of Present Illness   Alice Patton is a 73 year old female with pulmonary fibrosis who presents with worsening hypoxemia and dyspnea on exertion. Her son drove her here.  Over the past month, she experiences significant drops in oxygen saturation, reaching as low as 68% with exertion such as standing or walking. Her saturation is 97% after lying down overnight. She has increased her oxygen flow from 2 to 3 liters without significant improvement. Despite these episodes, she remains active by walking slowly around the house. She had been quite stable over the last year or two. The current change was mainly noted over a day or two, without obvious associated acute illness. I had her see Dr Geronimo 2 years ago, but she had asked to stay with me unless there were significant changes in her ILD, and she had not wanted antifibrotics then. I discussed possibility of Hamman-Rich, PE.  She  experiences weakness and instability in her legs, affecting her balance. Slight chest pain occurs with exertion, especially when breathing is difficult. No changes in her cough or cold symptoms are present. Inhalers contribute to shakiness and nervousness. Cardiology did not find obvious acute issues.  She ensures her oxygen concentrator is functioning properly and uses a portable oxygen system, including an Inogen device, which is effective. We put her on tank O2 here at 4 l and will ask [employer] to change her to a portable tank for continuous flow 4-5L.     Assessment and Plan:    Pulmonary fibrosis Sudden significant drop in oxygen levels upon exertion, concern for acute exacerbation or accelerated worsening. Differential includes pulmonary embolism, Hamman-Rich exacerbation of ILD. - Order high-resolution chest CT to assess fibrosis status. - Check blood work, including red blood count, to rule out other causes of hypoxemia. - Refer to Dr. Geronimo vs hospital for further evaluation based on findings..  Hypoxemia Significant hypoxemia with exertion, oxygen saturation drops to 72-77% upon standing or moving. Increased oxygen flow from 2 to 3 liters without significant improvement. - Request [employer] to change portable oxygen system to a 5-liter continuous flow compressed gas tank system. - Advise increasing oxygen flow to 4 liters during exertion.  Chronic obstructive pulmonary disease (COPD) COPD may contribute to respiratory symptoms. No change in cough or chest pain related to COPD exacerbation.  Weakness Weakness and wobbliness in legs, possibly related to hypoxemia and decreased mobility. - Encourage gentle walking and mobility exercises within the home. - Consider pulmonary rehabilitation once respiratory status is stabilized.  Follow-up Prefers in-person discussion of test  results to avoid distressing news over the phone. - Schedule follow-up appointment in three weeks to discuss test  results and further management.   05/23/24- 58 yoF former smoker (12 pkyrs) followed for COPD, ILD/UIP, Chronic Hypoxic Respiratory Failure, Lung Nodules/ adenopathy, complicated by CAD, AFib cardioverted/ Eliquis, GERD, DM2,  O2 4-5 sleep and activity/ [employer].        POC(Inogen) 2L  Changed to [employer] continuous flow tank 5L ? -Albuterol  HFA, Advair  HFA 115-21, Hosp 5/13-17- Acute on Chronic Hypoxic Respiratory Failure, COPD exacerbation, L ankle fx. Rx'd steroid burst.DC summary filled- significant for acute on chronic resp fail, COPD exacerbation, thoracic lymphadenopathy nonspecific but new since 2022. Discussed the use of AI scribe software for clinical note transcription with the patient, who gave verbal consent to proceed.  History of Present Illness   Alice Patton is a 73 year old female with pulmonary fibrosis and COPD who presents with worsening shortness of breath. Recent [facility] hospitalization- fell and broke L ankle, complicated by ILD/COPD. No obvious pneumonia, Cardiac event or PE. May just be progression of ILD has crossed a line beyond her ability to compensate. Arrived today  on our tank O2 4l. She has been experiencing worsening shortness of breath in recent months, limiting daily activities. Oxygen saturation decreases to 92-93% with exertion. She has difficulty breathing during sleep, waking up feeling 'smothery'. She uses supplemental oxygen at 4 liters per minute but struggles to maintain stable levels.  Recent CT scans show a few enlarged lymph nodes. She was hospitalized for breathing issues and treated with steroids, providing temporary relief. She continues to experience significant physical limitations. We discussed f/u CT in 6 months to look for change in ILD and nodes.  Her nose frequently clogs and dries due to oxygen use, causing discomfort and occasional epistaxis. She is to use saline nasal spray and nasal gel for relief.  She uses a regular walker and a wheelchair at  home. Her son has a rolling walker with a seat, but there may be hesitation about its safety for her use. No fever, pneumonia, blood clots, or overt heart failure. Difficulty breathing with exertion and during sleep.   She has seen Dr Geronimo here in past for ILD. It would be more convenient for her to identify an [facility] pulmonologist, since I explained I am retiring.     Assessment and Plan:    Pulmonary fibrosis Thoracic lymphadenopathy Progression suspected due to increased dyspnea and nocturnal symptoms. CT shows fibrosis with nonspecific enlarged lymph nodes. Ruled out thromboembolism, pneumonia, and heart failure. Current oxygen therapy insufficient for exertion. - Continue oxygen therapy at 4-6 L/min during exertion. - Consider referral to Dr. Geronimo or Dr. Theophilus for further management, vs [facility]. - Recommend saline nasal spray and nasal gel for nasal dryness. - Discussed transition of care to a local pulmonologist at [facility]- more convenient for her.  Chronic respiratory failure Difficulty maintaining oxygen saturation during exertion. Oxygen levels drop with minimal activity, requiring continuous management. Hospitalization needed for stabilization.  Fracture of left ankle Fracture on May 13th, currently non-weight bearing as per orthopedic instructions. Rehabilitation limited by pulmonary issues. - Continue non-weight bearing status as per orthopedic instructions. - Encourage use of a rolling walker with a seat for mobility support, if safe.     ROS-see HPI   + = positive Constitutional:    weight loss, night sweats, fevers, chills, fatigue, lassitude. HEENT:    headaches, +difficulty swallowing, tooth/dental problems, sore throat,  sneezing, itching, ear ache, nasal congestion, post nasal drip, snoring CV:    chest pain, orthopnea, PND, swelling in lower extremities, anasarca,                                   dizziness, palpitations Resp:   +shortness of breath with  exertion or at rest.                productive cough,   non-productive cough, coughing up of blood.              change in color of mucus.  wheezing.   Skin:    rash or lesions. GI:  No-   heartburn, indigestion, abdominal pain, nausea, vomiting, diarrhea,                 change in bowel habits, loss of appetite GU: dysuria, change in color of urine, no urgency or frequency.   flank pain. MS:   joint pain, stiffness, decreased range of motion, back pain. Neuro-     nothing unusual Psych:  change in mood or affect.  depression or anxiety.   memory loss.  OBJ- Physical Exam General- Alert, Oriented, Affect-appropriate/ anxious/ talkative, Distress- none acute  +POC 2L> our tank 4L (90% sat)    Skin- rash-none, lesions- none, excoriation- none Lymphadenopathy- none Head- atraumatic            Eyes- Gross vision intact, PERRLA, conjunctivae and secretions clear            Ears- Hearing, canals-normal            Nose- Clear, no-Septal dev, mucus, polyps, erosion, perforation             Throat- Mallampati II , mucosa clear , drainage- none, tonsils- atrophic Neck- flexible , trachea midline, no stridor , thyroid nl, carotid no bruit Chest - symmetrical excursion , unlabored           Heart/CV- RRR , no murmur , no gallop  , no rub, nl s1 s2                           - JVD-none , edema- none, stasis changes- none, varices- none           Lung-+ faint bilateral crackles lower third of back, wheeze- none, cough- none , dullness-none, rub- none           Chest wall-  Abd-  Br/ Gen/ Rectal- Not done, not indicated Extrem- +wheelchair Neuro- grossly intact to observation

## 2024-05-23 ENCOUNTER — Encounter: Payer: Self-pay | Admitting: Internal Medicine

## 2024-05-23 ENCOUNTER — Ambulatory Visit (INDEPENDENT_AMBULATORY_CARE_PROVIDER_SITE_OTHER): Admitting: Internal Medicine

## 2024-05-23 VITALS — BP 104/60 | HR 93 | Temp 97.8°F | Ht 63.0 in | Wt 132.0 lb

## 2024-05-23 DIAGNOSIS — J449 Chronic obstructive pulmonary disease, unspecified: Secondary | ICD-10-CM

## 2024-05-23 DIAGNOSIS — J8410 Pulmonary fibrosis, unspecified: Secondary | ICD-10-CM | POA: Diagnosis not present

## 2024-05-23 DIAGNOSIS — J9611 Chronic respiratory failure with hypoxia: Secondary | ICD-10-CM | POA: Diagnosis not present

## 2024-05-23 DIAGNOSIS — R531 Weakness: Secondary | ICD-10-CM

## 2024-05-23 DIAGNOSIS — Z87891 Personal history of nicotine dependence: Secondary | ICD-10-CM

## 2024-05-23 NOTE — Patient Instructions (Signed)
 Use oxygen at 4-6 L as needed to keep your saturation mostly between 88 and 94%.  We can continue current inhaled meds.  It would be good to have your primary care to refer you to an [facility] pulmonary doctor closer to home for your pulmonary fibrosis and your COPD.

## 2024-08-16 DEATH — deceased

## 2024-08-23 NOTE — Progress Notes (Deleted)
 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   03/27/24- 72 yoF former smoker (12 pkyrs) followed for COPD, ILD/UIP, Chronic Hypoxic Respiratory Failure, Lung Nodules, complicated by CAD, AFib cardioverted/ Eliquis, GERD, DM2,  O2 2-4L sleep and activity/ [employer].        POC(Inogen) 1-2L -Albuterol  HFA, Advair  HFA 115-21      Needs updated HRCT -------SaO2 dropping with any exertion to low 70's.   Discussed the use of AI scribe software for clinical note transcription with the patient, who gave verbal consent to proceed.  History of Present Illness   Alice Patton is a 73 year old female with pulmonary fibrosis who presents with worsening hypoxemia and dyspnea on exertion. Her son drove her here.  Over the past month, she experiences significant drops in oxygen saturation, reaching as low as 68% with exertion such as standing or walking. Her saturation is 97% after lying down overnight. She has increased her oxygen flow from 2 to 3 liters without significant improvement. Despite these episodes, she remains active by walking slowly around the house. She had been quite stable over the last year or two. The current change was mainly noted over a day or two, without obvious associated acute illness. I had her see Dr Geronimo 2 years ago, but she had asked to stay with me unless there were significant changes in her ILD, and she had not wanted antifibrotics then. I discussed possibility of Hamman-Rich, PE.  She  experiences weakness and instability in her legs, affecting her balance. Slight chest pain occurs with exertion, especially when breathing is difficult. No changes in her cough or cold symptoms are present. Inhalers contribute to shakiness and nervousness. Cardiology did not find obvious acute issues.  She ensures her oxygen concentrator is functioning properly and uses a portable oxygen system, including an Inogen device, which is effective. We put her on tank O2 here at 4 l and will ask [employer] to change her to a portable tank for continuous flow 4-5L.     Assessment and Plan:    Pulmonary fibrosis Sudden significant drop in oxygen levels upon exertion, concern for acute exacerbation or accelerated worsening. Differential includes pulmonary embolism, Hamman-Rich exacerbation of ILD. - Order high-resolution chest CT to assess fibrosis status. - Check blood work, including red blood count, to rule out other causes of hypoxemia. - Refer to Dr. Geronimo vs hospital for further evaluation based on findings..  Hypoxemia Significant hypoxemia with exertion, oxygen saturation drops to 72-77% upon standing or moving. Increased oxygen flow from 2 to 3 liters without significant improvement. - Request [employer] to change portable oxygen system to a 5-liter continuous flow compressed gas tank system. - Advise increasing oxygen flow to 4 liters during exertion.  Chronic obstructive pulmonary disease (COPD) COPD may contribute to respiratory symptoms. No change in cough or chest pain related to COPD exacerbation.  Weakness Weakness and wobbliness in legs, possibly related to hypoxemia and decreased mobility. - Encourage gentle walking and mobility exercises within the home. - Consider pulmonary rehabilitation once respiratory status is stabilized.  Follow-up Prefers in-person discussion of test  results to avoid distressing news over the phone. - Schedule follow-up appointment in three weeks to discuss test  results and further management.   05/23/24- 19 yoF former smoker (12 pkyrs) followed for COPD, ILD/UIP, Chronic Hypoxic Respiratory Failure, Lung Nodules/ adenopathy, complicated by CAD, AFib cardioverted/ Eliquis, GERD, DM2,  O2 4-5 sleep and activity/ [employer].        POC(Inogen) 2L  Changed to [employer] continuous flow tank 5L ? -Albuterol  HFA, Advair  HFA 115-21, Hosp 5/13-17- Acute on Chronic Hypoxic Respiratory Failure, COPD exacerbation, L ankle fx. Rx'd steroid burst.DC summary filled- significant for acute on chronic resp fail, COPD exacerbation, thoracic lymphadenopathy nonspecific but new since 2022. Discussed the use of AI scribe software for clinical note transcription with the patient, who gave verbal consent to proceed.  History of Present Illness   Alice Patton is a 73 year old female with pulmonary fibrosis and COPD who presents with worsening shortness of breath. Recent [facility] hospitalization- fell and broke L ankle, complicated by ILD/COPD. No obvious pneumonia, Cardiac event or PE. May just be progression of ILD has crossed a line beyond her ability to compensate. Arrived today  on our tank O2 4l. She has been experiencing worsening shortness of breath in recent months, limiting daily activities. Oxygen saturation decreases to 92-93% with exertion. She has difficulty breathing during sleep, waking up feeling 'smothery'. She uses supplemental oxygen at 4 liters per minute but struggles to maintain stable levels.  Recent CT scans show a few enlarged lymph nodes. She was hospitalized for breathing issues and treated with steroids, providing temporary relief. She continues to experience significant physical limitations. We discussed f/u CT in 6 months to look for change in ILD and nodes.  Her nose frequently clogs and dries due to oxygen use, causing discomfort and occasional epistaxis. She is to use saline nasal spray and nasal gel for relief.  She uses a regular walker and a wheelchair at  home. Her son has a rolling walker with a seat, but there may be hesitation about its safety for her use. No fever, pneumonia, blood clots, or overt heart failure. Difficulty breathing with exertion and during sleep.   She has seen Dr Geronimo here in past for ILD. It would be more convenient for her to identify an [facility] pulmonologist, since I explained I am retiring.     Assessment and Plan:    Pulmonary fibrosis Thoracic lymphadenopathy Progression suspected due to increased dyspnea and nocturnal symptoms. CT shows fibrosis with nonspecific enlarged lymph nodes. Ruled out thromboembolism, pneumonia, and heart failure. Current oxygen therapy insufficient for exertion. - Continue oxygen therapy at 4-6 L/min during exertion. - Consider referral to Dr. Geronimo or Dr. Theophilus for further management, vs [facility]. - Recommend saline nasal spray and nasal gel for nasal dryness. - Discussed transition of care to a local pulmonologist at [facility]- more convenient for her.  Chronic respiratory failure Difficulty maintaining oxygen saturation during exertion. Oxygen levels drop with minimal activity, requiring continuous management. Hospitalization needed for stabilization.  Fracture of left ankle Fracture on May 13th, currently non-weight bearing as per orthopedic instructions. Rehabilitation limited by pulmonary issues. - Continue non-weight bearing status as per orthopedic instructions. - Encourage use of a rolling walker with a seat for mobility support, if safe.   08/24/24- 73 yoF former smoker (12 pkyrs) followed for COPD, ILD/UIP, Chronic Hypoxic Respiratory Failure, Lung Nodules/ adenopathy, complicated by CAD, AFib cardioverted/ Eliquis, GERD, DM2, PT EXPIRED NOVANT HYPOXIC RESP FAILURE 08/10/24 O2 4-5 sleep and  activity/ [employer].        POC(Inogen) 2L  Changed to [employer] continuous flow tank 5L ?       ROS-see HPI   + = positive Constitutional:    weight loss, night sweats, fevers, chills,  fatigue, lassitude. HEENT:    headaches, +difficulty swallowing, tooth/dental problems, sore throat,       sneezing, itching, ear ache, nasal congestion, post nasal drip, snoring CV:    chest pain, orthopnea, PND, swelling in lower extremities, anasarca,                                   dizziness, palpitations Resp:   +shortness of breath with exertion or at rest.                productive cough,   non-productive cough, coughing up of blood.              change in color of mucus.  wheezing.   Skin:    rash or lesions. GI:  No-   heartburn, indigestion, abdominal pain, nausea, vomiting, diarrhea,                 change in bowel habits, loss of appetite GU: dysuria, change in color of urine, no urgency or frequency.   flank pain. MS:   joint pain, stiffness, decreased range of motion, back pain. Neuro-     nothing unusual Psych:  change in mood or affect.  depression or anxiety.   memory loss.  OBJ- Physical Exam General- Alert, Oriented, Affect-appropriate/ anxious/ talkative, Distress- none acute  +POC 2L> our tank 4L (90% sat)    Skin- rash-none, lesions- none, excoriation- none Lymphadenopathy- none Head- atraumatic            Eyes- Gross vision intact, PERRLA, conjunctivae and secretions clear            Ears- Hearing, canals-normal            Nose- Clear, no-Septal dev, mucus, polyps, erosion, perforation             Throat- Mallampati II , mucosa clear , drainage- none, tonsils- atrophic Neck- flexible , trachea midline, no stridor , thyroid nl, carotid no bruit Chest - symmetrical excursion , unlabored           Heart/CV- RRR , no murmur , no gallop  , no rub, nl s1 s2                           - JVD-none , edema- none, stasis changes- none, varices- none           Lung-+ faint bilateral crackles lower third of back, wheeze- none, cough- none , dullness-none, rub- none           Chest wall-  Abd-  Br/ Gen/ Rectal- Not done, not indicated Extrem- +wheelchair Neuro- grossly  intact to observation

## 2024-08-24 ENCOUNTER — Ambulatory Visit: Admitting: Internal Medicine

## 2024-08-24 ENCOUNTER — Encounter: Payer: Self-pay | Admitting: Internal Medicine

## 2024-08-24 DIAGNOSIS — J849 Interstitial pulmonary disease, unspecified: Secondary | ICD-10-CM

## 2024-08-24 DIAGNOSIS — J449 Chronic obstructive pulmonary disease, unspecified: Secondary | ICD-10-CM

## 2024-08-24 DIAGNOSIS — J9611 Chronic respiratory failure with hypoxia: Secondary | ICD-10-CM
